# Patient Record
Sex: Female | Born: 1935
Health system: Southern US, Community
[De-identification: ages and names within clinical notes are randomized; demographics above are authoritative.]

## PROBLEM LIST (undated history)

## (undated) DIAGNOSIS — C4491 Basal cell carcinoma of skin, unspecified: Secondary | ICD-10-CM

## (undated) DIAGNOSIS — C4492 Squamous cell carcinoma of skin, unspecified: Secondary | ICD-10-CM

## (undated) DIAGNOSIS — I10 Essential (primary) hypertension: Secondary | ICD-10-CM

## (undated) HISTORY — DX: Squamous cell carcinoma of skin, unspecified: C44.92

## (undated) HISTORY — DX: Basal cell carcinoma of skin, unspecified: C44.91

## (undated) HISTORY — PX: COLONOSCOPY: SHX174

## (undated) HISTORY — PX: CATARACT EXTRACTION: SUR2

## (undated) HISTORY — PX: MINI-LAPAROTOMY W/ TUBAL LIGATION: SUR811

## (undated) HISTORY — PX: TONSILECTOMY, ADENOIDECTOMY, BILATERAL MYRINGOTOMY AND TUBES: SHX2538

## (undated) HISTORY — DX: Essential (primary) hypertension: I10

---

## 1998-05-31 ENCOUNTER — Other Ambulatory Visit: Admission: RE | Admit: 1998-05-31 | Discharge: 1998-05-31 | Payer: Self-pay | Admitting: Obstetrics & Gynecology

## 1999-05-15 ENCOUNTER — Other Ambulatory Visit: Admission: RE | Admit: 1999-05-15 | Discharge: 1999-05-15 | Payer: Self-pay | Admitting: Obstetrics & Gynecology

## 2000-06-11 ENCOUNTER — Other Ambulatory Visit: Admission: RE | Admit: 2000-06-11 | Discharge: 2000-06-11 | Payer: Self-pay | Admitting: Obstetrics & Gynecology

## 2002-07-23 ENCOUNTER — Other Ambulatory Visit: Admission: RE | Admit: 2002-07-23 | Discharge: 2002-07-23 | Payer: Self-pay | Admitting: Obstetrics & Gynecology

## 2003-07-26 ENCOUNTER — Other Ambulatory Visit: Admission: RE | Admit: 2003-07-26 | Discharge: 2003-07-26 | Payer: Self-pay | Admitting: Obstetrics & Gynecology

## 2005-08-21 ENCOUNTER — Other Ambulatory Visit: Admission: RE | Admit: 2005-08-21 | Discharge: 2005-08-21 | Payer: Self-pay | Admitting: Obstetrics & Gynecology

## 2006-04-25 ENCOUNTER — Encounter: Admission: RE | Admit: 2006-04-25 | Discharge: 2006-04-25 | Payer: Self-pay | Admitting: Internal Medicine

## 2009-02-14 ENCOUNTER — Encounter: Admission: RE | Admit: 2009-02-14 | Discharge: 2009-02-14 | Payer: Self-pay | Admitting: Internal Medicine

## 2009-04-18 ENCOUNTER — Encounter: Admission: RE | Admit: 2009-04-18 | Discharge: 2009-04-18 | Payer: Self-pay | Admitting: Internal Medicine

## 2009-04-22 ENCOUNTER — Encounter: Admission: RE | Admit: 2009-04-22 | Discharge: 2009-04-22 | Payer: Self-pay | Admitting: Internal Medicine

## 2009-05-20 ENCOUNTER — Encounter: Admission: RE | Admit: 2009-05-20 | Discharge: 2009-05-20 | Payer: Self-pay | Admitting: Internal Medicine

## 2011-02-22 ENCOUNTER — Encounter: Payer: Self-pay | Admitting: Gastroenterology

## 2011-03-01 NOTE — Letter (Signed)
Summary: Colonoscopy Letter  [facility]  [address] [place], [place] [address]   Phone: [phone]  Fax: [phone]      February 22, 2011 MRN: [id number]   Faith Barrett [address] [place], [place]  [address]   Dear Ms. Grissinger,   According to your medical record, it is time for you to schedule a Colonoscopy. The American Cancer Society recommends this procedure as a method to detect early colon cancer. Patients with a family history of colon cancer, or a personal history of colon polyps or inflammatory bowel disease are at increased risk.  This letter has been generated based on the recommendations made at the time of your procedure. If you feel that in your particular situation this may no longer apply, please contact our office.  Please call our office at [phone] to schedule this appointment or to update your records at your earliest convenience.  Thank you for cooperating with [place] to provide you with the very best care possible.   Sincerely,  Judie Petit T. Russella Dar, M.D. [facility] Gastroenterology Division [phone]

## 2011-03-02 ENCOUNTER — Encounter (INDEPENDENT_AMBULATORY_CARE_PROVIDER_SITE_OTHER): Payer: Self-pay | Admitting: *Deleted

## 2011-03-06 NOTE — Letter (Signed)
Summary: Pre Visit Letter Revised  [facility]  [address] [place], [place] [address]   Phone: [phone]  Fax: [phone]        03/02/2011 MRN: [id number] Faith Barrett [address] [place], [place]  [address]  [place]             Procedure Date:  04-10-11           Recall Colon----Dr. Russella Dar   Welcome to the Gastroenterology Division at [facility].    You are scheduled to see a nurse for your pre-procedure visit on 03-27-11 at 1:30p.m. on the 3rd floor at [facility], [address]  We ask that you try to arrive at our office 15 minutes prior to your appointment time to allow for check-in.  Please take a minute to review the attached form.  If you answer "Yes" to one or more of the questions on the first page, we ask that you call the person listed at your earliest opportunity.  If you answer "No" to all of the questions, please complete the rest of the form and bring it to your appointment.    Your nurse visit will consist of discussing your medical and surgical history, your immediate family medical history, and your medications.   If you are unable to list all of your medications on the form, please bring the medication bottles to your appointment and we will list them.  We will need to be aware of both prescribed and over the counter drugs.  We will need to know exact dosage information as well.    Please be prepared to read and sign documents such as consent forms, a financial agreement, and acknowledgement forms.  If necessary, and with your consent, a friend or relative is welcome to sit-in on the nurse visit with you.  Please bring your insurance card so that we may make a copy of it.  If your insurance requires a referral to see a specialist, please bring your referral form from your primary care physician.  No co-pay is required for this nurse visit.     If you cannot keep your appointment, please call [phone] to cancel or reschedule prior to  your appointment date.  This allows [place] the opportunity to schedule an appointment for another patient in need of care.    Thank you for choosing [facility] for your medical needs.  We appreciate the opportunity to care for you.  Please visit [place] at our website  to learn more about our practice.  Sincerely, The Gastroenterology Division

## 2011-03-27 ENCOUNTER — Encounter: Payer: Self-pay | Admitting: Gastroenterology

## 2011-03-27 ENCOUNTER — Ambulatory Visit (AMBULATORY_SURGERY_CENTER): Payer: Medicare Other

## 2011-03-27 VITALS — Ht 61.0 in | Wt 148.5 lb

## 2011-03-27 DIAGNOSIS — Z1211 Encounter for screening for malignant neoplasm of colon: Secondary | ICD-10-CM

## 2011-03-27 MED ORDER — PEG-KCL-NACL-NASULF-NA ASC-C 100 G PO SOLR: 1.0000 | Freq: Once | ORAL | Status: AC

## 2011-04-03 DIAGNOSIS — D126 Benign neoplasm of colon, unspecified: Secondary | ICD-10-CM

## 2011-04-03 HISTORY — DX: Benign neoplasm of colon, unspecified: D12.6

## 2011-04-10 ENCOUNTER — Encounter: Payer: Self-pay | Admitting: Gastroenterology

## 2011-04-10 ENCOUNTER — Ambulatory Visit (AMBULATORY_SURGERY_CENTER): Payer: Medicare Other | Admitting: Gastroenterology

## 2011-04-10 VITALS — BP 136/84 | HR 72 | Temp 98.1°F | Resp 18 | Ht 61.0 in | Wt 148.0 lb

## 2011-04-10 DIAGNOSIS — K5730 Diverticulosis of large intestine without perforation or abscess without bleeding: Secondary | ICD-10-CM

## 2011-04-10 DIAGNOSIS — D126 Benign neoplasm of colon, unspecified: Secondary | ICD-10-CM

## 2011-04-10 DIAGNOSIS — Z1211 Encounter for screening for malignant neoplasm of colon: Secondary | ICD-10-CM

## 2011-04-10 MED ORDER — SODIUM CHLORIDE 0.9 % IV SOLN: 500.0000 mL | INTRAVENOUS | Status: DC

## 2011-04-10 NOTE — Patient Instructions (Signed)
Refer to blue and green discharge instruction sheets. 

## 2011-04-11 ENCOUNTER — Telehealth: Payer: Self-pay | Admitting: *Deleted

## 2011-04-11 NOTE — Telephone Encounter (Signed)

## 2011-04-16 ENCOUNTER — Encounter: Payer: Self-pay | Admitting: Gastroenterology

## 2011-06-29 ENCOUNTER — Other Ambulatory Visit: Payer: Self-pay

## 2014-12-10 DIAGNOSIS — Z23 Encounter for immunization: Secondary | ICD-10-CM | POA: Diagnosis not present

## 2015-02-28 DIAGNOSIS — Z1231 Encounter for screening mammogram for malignant neoplasm of breast: Secondary | ICD-10-CM | POA: Diagnosis not present

## 2015-04-11 DIAGNOSIS — Z008 Encounter for other general examination: Secondary | ICD-10-CM | POA: Diagnosis not present

## 2015-04-11 DIAGNOSIS — Z0000 Encounter for general adult medical examination without abnormal findings: Secondary | ICD-10-CM | POA: Diagnosis not present

## 2015-04-11 DIAGNOSIS — D509 Iron deficiency anemia, unspecified: Secondary | ICD-10-CM | POA: Diagnosis not present

## 2015-04-11 DIAGNOSIS — E780 Pure hypercholesterolemia: Secondary | ICD-10-CM | POA: Diagnosis not present

## 2015-09-21 DIAGNOSIS — R197 Diarrhea, unspecified: Secondary | ICD-10-CM | POA: Diagnosis not present

## 2015-09-21 DIAGNOSIS — I10 Essential (primary) hypertension: Secondary | ICD-10-CM | POA: Diagnosis not present

## 2015-09-21 DIAGNOSIS — Z23 Encounter for immunization: Secondary | ICD-10-CM | POA: Diagnosis not present

## 2015-09-22 DIAGNOSIS — H52203 Unspecified astigmatism, bilateral: Secondary | ICD-10-CM | POA: Diagnosis not present

## 2015-09-22 DIAGNOSIS — H5213 Myopia, bilateral: Secondary | ICD-10-CM | POA: Diagnosis not present

## 2015-09-22 DIAGNOSIS — H524 Presbyopia: Secondary | ICD-10-CM | POA: Diagnosis not present

## 2015-09-22 DIAGNOSIS — Z961 Presence of intraocular lens: Secondary | ICD-10-CM | POA: Diagnosis not present

## 2016-02-09 ENCOUNTER — Encounter: Payer: Self-pay | Admitting: Gastroenterology

## 2016-03-09 DIAGNOSIS — Z1231 Encounter for screening mammogram for malignant neoplasm of breast: Secondary | ICD-10-CM | POA: Diagnosis not present

## 2016-04-11 DIAGNOSIS — E7800 Pure hypercholesterolemia, unspecified: Secondary | ICD-10-CM | POA: Diagnosis not present

## 2016-04-11 DIAGNOSIS — Z0000 Encounter for general adult medical examination without abnormal findings: Secondary | ICD-10-CM | POA: Diagnosis not present

## 2016-04-11 DIAGNOSIS — D559 Anemia due to enzyme disorder, unspecified: Secondary | ICD-10-CM | POA: Diagnosis not present

## 2016-04-11 DIAGNOSIS — K51419 Inflammatory polyps of colon with unspecified complications: Secondary | ICD-10-CM | POA: Diagnosis not present

## 2016-04-11 DIAGNOSIS — I119 Hypertensive heart disease without heart failure: Secondary | ICD-10-CM | POA: Diagnosis not present

## 2016-04-11 DIAGNOSIS — I152 Hypertension secondary to endocrine disorders: Secondary | ICD-10-CM | POA: Diagnosis not present

## 2016-04-22 ENCOUNTER — Emergency Department (HOSPITAL_COMMUNITY)
Admission: EM | Admit: 2016-04-22 | Discharge: 2016-04-22 | Disposition: A | Discharge status: Home / Self Care | Payer: Medicare Other | Attending: Emergency Medicine | Admitting: Emergency Medicine

## 2016-04-22 ENCOUNTER — Encounter (HOSPITAL_COMMUNITY): Payer: Self-pay

## 2016-04-22 DIAGNOSIS — I10 Essential (primary) hypertension: Secondary | ICD-10-CM | POA: Diagnosis not present

## 2016-04-22 DIAGNOSIS — Z791 Long term (current) use of non-steroidal anti-inflammatories (NSAID): Secondary | ICD-10-CM | POA: Insufficient documentation

## 2016-04-22 DIAGNOSIS — M545 Low back pain, unspecified: Secondary | ICD-10-CM

## 2016-04-22 DIAGNOSIS — Z79899 Other long term (current) drug therapy: Secondary | ICD-10-CM | POA: Diagnosis not present

## 2016-04-22 DIAGNOSIS — M25551 Pain in right hip: Secondary | ICD-10-CM | POA: Diagnosis present

## 2016-04-22 MED ORDER — METAXALONE 400 MG PO TABS: 400.0000 mg | ORAL_TABLET | Freq: Three times a day (TID) | ORAL | Status: DC | PRN

## 2016-04-22 MED ORDER — IBUPROFEN 400 MG PO TABS: 400.0000 mg | ORAL_TABLET | Freq: Three times a day (TID) | ORAL | Status: DC | PRN

## 2016-04-22 NOTE — ED Notes (Signed)
Pt with rt hip pain x 2 months.  Pt states starts off/on with increased activity.  No injury.  Pt was doing yard work on Friday and pain has worsened.

## 2016-04-22 NOTE — ED Provider Notes (Signed)
CSN: [id number]     Arrival date & time 04/22/16  [id number] History   First MD Initiated Contact with Patient 04/22/16 [phone]     Chief Complaint  Patient presents with  . Hip Pain     Patient is a 82 y.o. female presenting with hip pain. The history is provided by the patient.  Hip Pain Pertinent negatives include no abdominal pain.  Patient with right posterior hip pain. Had the last day or 2. States she did some vacuuming and began to hurt after that. No fall. No numbness or weakness. She has had no episodes like this in the past after she had done some weeding in the yard. No change in her urinary habits. No fevers. No cancer history. No fevers. No injection drug use.  Past Medical History  Diagnosis Date  . Cataract   . Hypertension    Past Surgical History  Procedure Laterality Date  . Cataract extraction      Bil/ 2010  . Colonoscopy    . Tonsilectomy, adenoidectomy, bilateral myringotomy and tubes    . Mini-laparotomy w/ tubal ligation     History reviewed. No pertinent family history. Social History  Substance Use Topics  . Smoking status: Never Smoker   . Smokeless tobacco: Never Used  . Alcohol Use: No   OB History    No data available     Review of Systems  Gastrointestinal: Negative for abdominal pain.  Genitourinary: Negative for dysuria.  Musculoskeletal: Positive for gait problem. Negative for back pain, joint swelling, neck pain and neck stiffness.  Skin: Negative for rash and wound.  Hematological: Negative for adenopathy.      Allergies  Review of patient's allergies indicates no known allergies.  Home Medications   Prior to Admission medications   Medication Sig Start Date End Date Taking? Authorizing Provider  calcium-vitamin D (OSCAL WITH D) 500-200 MG-UNIT per tablet Take 1 tablet by mouth daily. Take 2000iu Daily    Yes Historical Provider, MD  lisinopril (PRINIVIL,ZESTRIL) 10 MG tablet Take 10 mg by mouth daily.     Yes Historical Provider, MD   ibuprofen (ADVIL,MOTRIN) 400 MG tablet Take 1 tablet (400 mg total) by mouth every 8 (eight) hours as needed. 04/22/16   Davonna Belling, MD  metaxalone (SKELAXIN) 400 MG tablet Take 1 tablet (400 mg total) by mouth 3 (three) times daily as needed for muscle spasms. 04/22/16   Davonna Belling, MD   BP 126/67 mmHg  Pulse 83  Temp(Src) 98.4 F (36.9 C) (Oral)  Resp 20  Ht 5\' 1"  (1.549 m)  Wt 125 lb (56.7 kg)  BMI 23.63 kg/m2  SpO2 97% Physical Exam  Constitutional: She appears well-developed and well-nourished.  Abdominal: Soft. There is no tenderness.  Musculoskeletal: She exhibits tenderness.  Tenderness to right posterior superior buttock area. Mild erethema that patient states is from a pain patch. Good ROM to right hip and no pain with straight leg raise. No tenderness over right hip laterally.   Neurological: She is alert.  Skin: Skin is warm.    ED Course  Procedures (including critical care time) Labs Review Labs Reviewed - No data to display  Imaging Review No results found. I have personally reviewed and evaluated these images and lab results as part of my medical decision-making.   EKG Interpretation None      MDM   Final diagnoses:  Right-sided low back pain without sciatica    Patient with back pain. Appears to be likely muscle  skeletal. No red flags  Except her age. She   has had similar episodes in the past of exertion. We'll give some treatment and follow-up as needed.   Davonna Belling, MD 04/22/16 [phone]

## 2016-04-22 NOTE — Discharge Instructions (Signed)

## 2016-04-27 ENCOUNTER — Ambulatory Visit
Admission: RE | Admit: 2016-04-27 | Discharge: 2016-04-27 | Disposition: A | Discharge status: Home / Self Care | Payer: Medicare Other | Source: Ambulatory Visit | Attending: Internal Medicine | Admitting: Internal Medicine

## 2016-04-27 ENCOUNTER — Other Ambulatory Visit: Payer: Self-pay | Admitting: Internal Medicine

## 2016-04-27 DIAGNOSIS — M25551 Pain in right hip: Secondary | ICD-10-CM

## 2016-07-24 DIAGNOSIS — I119 Hypertensive heart disease without heart failure: Secondary | ICD-10-CM | POA: Diagnosis not present

## 2016-10-03 DIAGNOSIS — Z23 Encounter for immunization: Secondary | ICD-10-CM | POA: Diagnosis not present

## 2016-10-16 DIAGNOSIS — I119 Hypertensive heart disease without heart failure: Secondary | ICD-10-CM | POA: Diagnosis not present

## 2016-10-16 DIAGNOSIS — M25552 Pain in left hip: Secondary | ICD-10-CM | POA: Diagnosis not present

## 2016-12-28 DIAGNOSIS — J40 Bronchitis, not specified as acute or chronic: Secondary | ICD-10-CM | POA: Diagnosis not present

## 2017-01-07 DIAGNOSIS — J209 Acute bronchitis, unspecified: Secondary | ICD-10-CM | POA: Diagnosis not present

## 2017-01-07 DIAGNOSIS — I10 Essential (primary) hypertension: Secondary | ICD-10-CM | POA: Diagnosis not present

## 2017-01-21 DIAGNOSIS — J208 Acute bronchitis due to other specified organisms: Secondary | ICD-10-CM | POA: Diagnosis not present

## 2017-01-21 DIAGNOSIS — I10 Essential (primary) hypertension: Secondary | ICD-10-CM | POA: Diagnosis not present

## 2017-02-06 DIAGNOSIS — M9903 Segmental and somatic dysfunction of lumbar region: Secondary | ICD-10-CM | POA: Diagnosis not present

## 2017-02-06 DIAGNOSIS — M4726 Other spondylosis with radiculopathy, lumbar region: Secondary | ICD-10-CM | POA: Diagnosis not present

## 2017-02-11 DIAGNOSIS — M9903 Segmental and somatic dysfunction of lumbar region: Secondary | ICD-10-CM | POA: Diagnosis not present

## 2017-02-11 DIAGNOSIS — M4726 Other spondylosis with radiculopathy, lumbar region: Secondary | ICD-10-CM | POA: Diagnosis not present

## 2017-02-12 DIAGNOSIS — M4726 Other spondylosis with radiculopathy, lumbar region: Secondary | ICD-10-CM | POA: Diagnosis not present

## 2017-02-12 DIAGNOSIS — M9903 Segmental and somatic dysfunction of lumbar region: Secondary | ICD-10-CM | POA: Diagnosis not present

## 2017-02-13 DIAGNOSIS — M4726 Other spondylosis with radiculopathy, lumbar region: Secondary | ICD-10-CM | POA: Diagnosis not present

## 2017-02-13 DIAGNOSIS — M9903 Segmental and somatic dysfunction of lumbar region: Secondary | ICD-10-CM | POA: Diagnosis not present

## 2017-02-18 DIAGNOSIS — M4726 Other spondylosis with radiculopathy, lumbar region: Secondary | ICD-10-CM | POA: Diagnosis not present

## 2017-02-18 DIAGNOSIS — M9903 Segmental and somatic dysfunction of lumbar region: Secondary | ICD-10-CM | POA: Diagnosis not present

## 2017-02-19 DIAGNOSIS — M4726 Other spondylosis with radiculopathy, lumbar region: Secondary | ICD-10-CM | POA: Diagnosis not present

## 2017-02-19 DIAGNOSIS — M9903 Segmental and somatic dysfunction of lumbar region: Secondary | ICD-10-CM | POA: Diagnosis not present

## 2017-02-20 DIAGNOSIS — M9903 Segmental and somatic dysfunction of lumbar region: Secondary | ICD-10-CM | POA: Diagnosis not present

## 2017-02-20 DIAGNOSIS — M4726 Other spondylosis with radiculopathy, lumbar region: Secondary | ICD-10-CM | POA: Diagnosis not present

## 2017-02-25 DIAGNOSIS — M9903 Segmental and somatic dysfunction of lumbar region: Secondary | ICD-10-CM | POA: Diagnosis not present

## 2017-02-25 DIAGNOSIS — M4726 Other spondylosis with radiculopathy, lumbar region: Secondary | ICD-10-CM | POA: Diagnosis not present

## 2017-02-26 DIAGNOSIS — M9903 Segmental and somatic dysfunction of lumbar region: Secondary | ICD-10-CM | POA: Diagnosis not present

## 2017-02-26 DIAGNOSIS — M4726 Other spondylosis with radiculopathy, lumbar region: Secondary | ICD-10-CM | POA: Diagnosis not present

## 2017-02-27 DIAGNOSIS — M9903 Segmental and somatic dysfunction of lumbar region: Secondary | ICD-10-CM | POA: Diagnosis not present

## 2017-02-27 DIAGNOSIS — M4726 Other spondylosis with radiculopathy, lumbar region: Secondary | ICD-10-CM | POA: Diagnosis not present

## 2017-03-04 DIAGNOSIS — M9903 Segmental and somatic dysfunction of lumbar region: Secondary | ICD-10-CM | POA: Diagnosis not present

## 2017-03-04 DIAGNOSIS — M4726 Other spondylosis with radiculopathy, lumbar region: Secondary | ICD-10-CM | POA: Diagnosis not present

## 2017-03-11 DIAGNOSIS — M4726 Other spondylosis with radiculopathy, lumbar region: Secondary | ICD-10-CM | POA: Diagnosis not present

## 2017-03-11 DIAGNOSIS — M9903 Segmental and somatic dysfunction of lumbar region: Secondary | ICD-10-CM | POA: Diagnosis not present

## 2017-03-12 DIAGNOSIS — M4726 Other spondylosis with radiculopathy, lumbar region: Secondary | ICD-10-CM | POA: Diagnosis not present

## 2017-03-12 DIAGNOSIS — M9903 Segmental and somatic dysfunction of lumbar region: Secondary | ICD-10-CM | POA: Diagnosis not present

## 2017-03-13 DIAGNOSIS — Z1231 Encounter for screening mammogram for malignant neoplasm of breast: Secondary | ICD-10-CM | POA: Diagnosis not present

## 2017-04-17 DIAGNOSIS — K635 Polyp of colon: Secondary | ICD-10-CM | POA: Diagnosis not present

## 2017-04-17 DIAGNOSIS — Z0000 Encounter for general adult medical examination without abnormal findings: Secondary | ICD-10-CM | POA: Diagnosis not present

## 2017-04-17 DIAGNOSIS — I10 Essential (primary) hypertension: Secondary | ICD-10-CM | POA: Diagnosis not present

## 2017-04-17 DIAGNOSIS — C4490 Unspecified malignant neoplasm of skin, unspecified: Secondary | ICD-10-CM | POA: Diagnosis not present

## 2018-02-05 IMAGING — CR DG HIP (WITH OR WITHOUT PELVIS) 2-3V*R*
4 series · 4 of 4 positions shown · non-contrast
Comparison: None.

CLINICAL DATA: Patient starting having right posterior hip pain at
the waist [DATE] after doing some gardening. She recalls a time while
vacuuming and working in the garden over 1 month ago causing the
same pain.

EXAM:
DG HIP (WITH OR WITHOUT PELVIS) 2-3V RIGHT

[w pelvis upright]
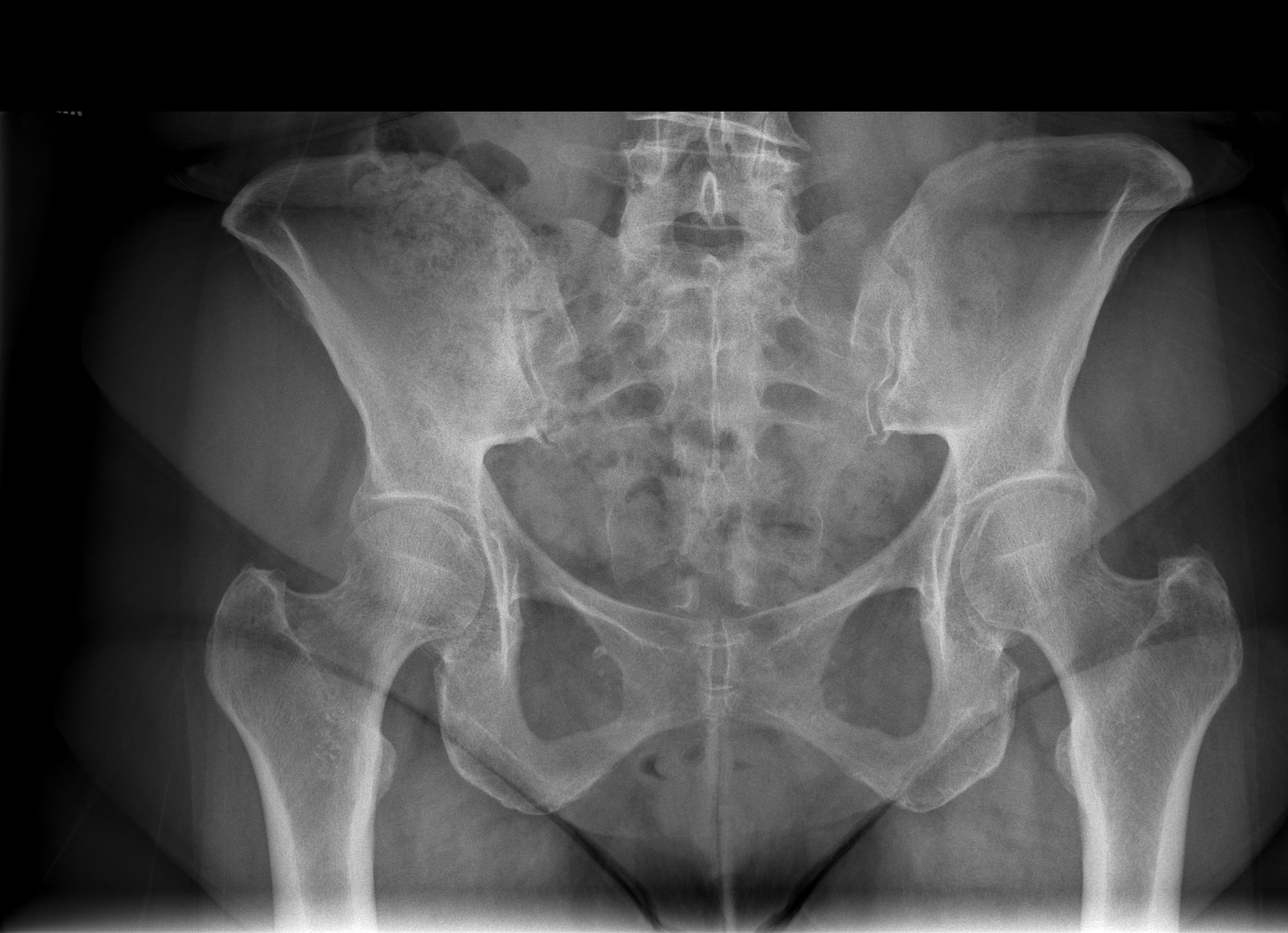

[w hip ap right]
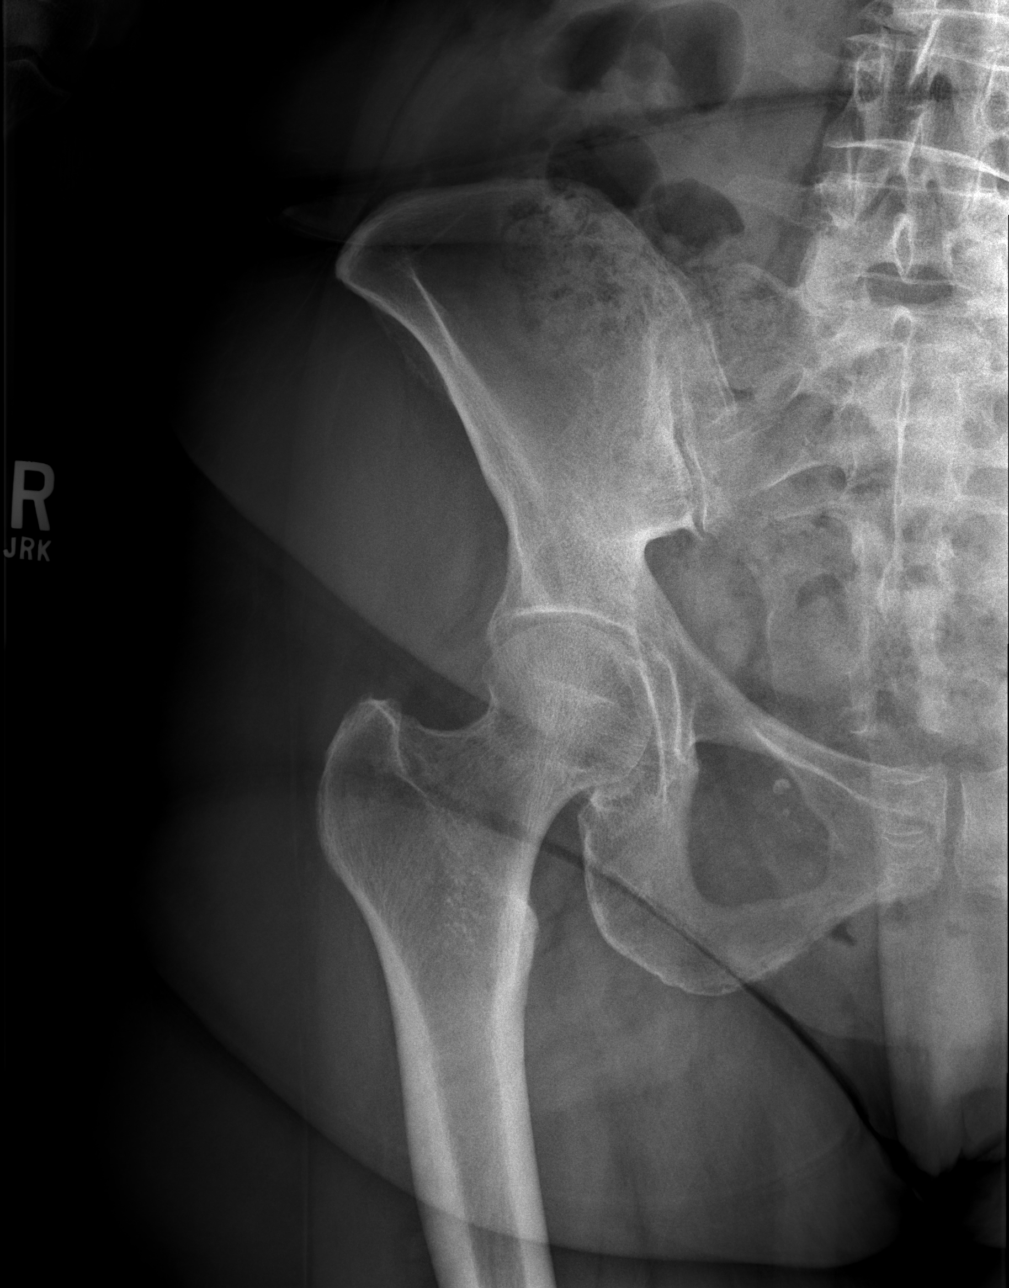

[w hip frog right (1 of 2)]
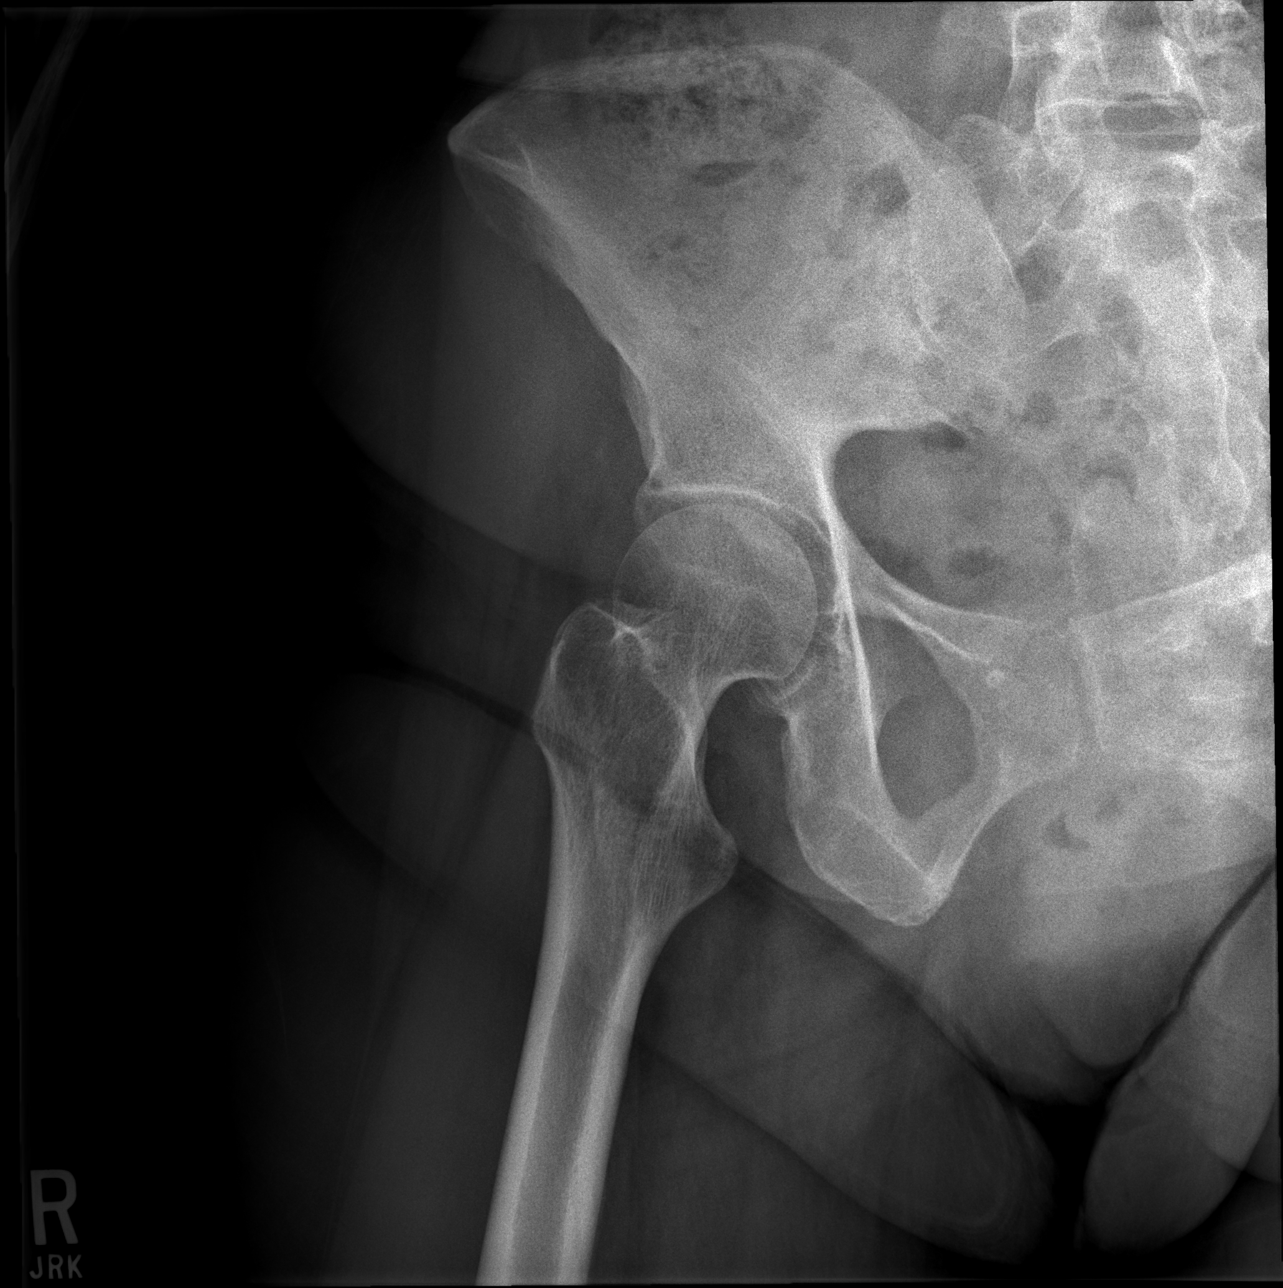

[w hip frog right (2 of 2)]
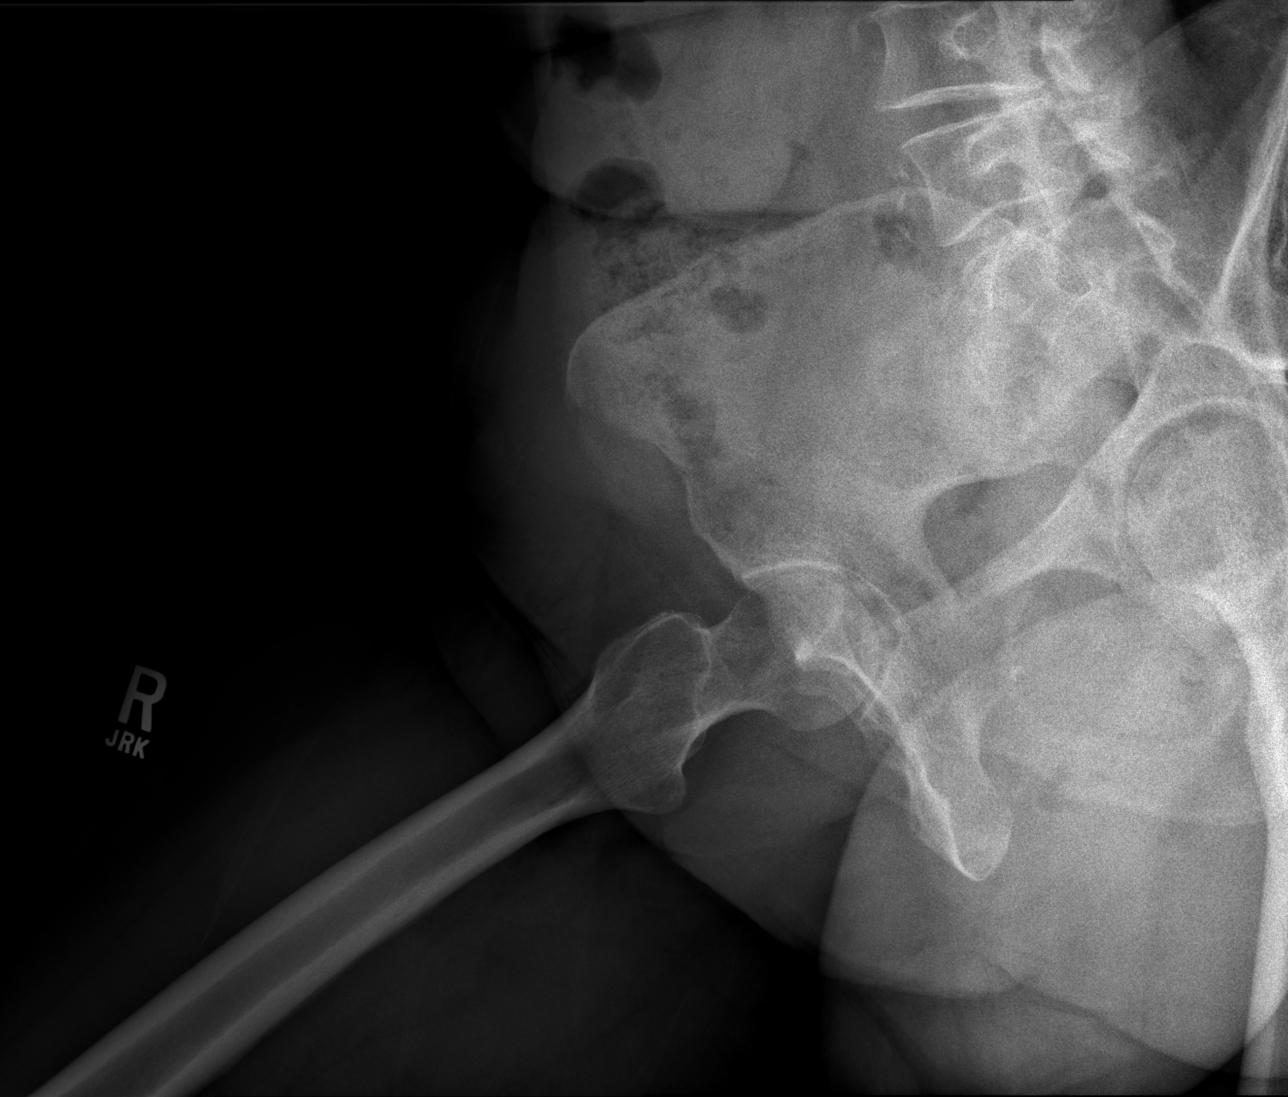

[4 of 4 positions shown; findings below may reference images not displayed]

FINDINGS: There is no evidence of hip fracture or dislocation. There is no
evidence of arthropathy or other focal bone abnormality.
IMPRESSION: Negative.

## 2018-09-27 ENCOUNTER — Encounter (HOSPITAL_COMMUNITY): Payer: Self-pay

## 2018-09-27 ENCOUNTER — Emergency Department (HOSPITAL_COMMUNITY): Payer: Medicare Other

## 2018-09-27 ENCOUNTER — Other Ambulatory Visit: Payer: Self-pay

## 2018-09-27 ENCOUNTER — Emergency Department (HOSPITAL_COMMUNITY)
Admission: EM | Admit: 2018-09-27 | Discharge: 2018-09-27 | Disposition: A | Discharge status: Home / Self Care | Payer: Medicare Other | Attending: Emergency Medicine | Admitting: Emergency Medicine

## 2018-09-27 DIAGNOSIS — I10 Essential (primary) hypertension: Secondary | ICD-10-CM | POA: Insufficient documentation

## 2018-09-27 DIAGNOSIS — Y9302 Activity, running: Secondary | ICD-10-CM | POA: Diagnosis not present

## 2018-09-27 DIAGNOSIS — Y999 Unspecified external cause status: Secondary | ICD-10-CM | POA: Diagnosis not present

## 2018-09-27 DIAGNOSIS — Y929 Unspecified place or not applicable: Secondary | ICD-10-CM | POA: Insufficient documentation

## 2018-09-27 DIAGNOSIS — S43005A Unspecified dislocation of left shoulder joint, initial encounter: Secondary | ICD-10-CM | POA: Diagnosis not present

## 2018-09-27 DIAGNOSIS — S0083XA Contusion of other part of head, initial encounter: Secondary | ICD-10-CM | POA: Diagnosis not present

## 2018-09-27 DIAGNOSIS — W01198A Fall on same level from slipping, tripping and stumbling with subsequent striking against other object, initial encounter: Secondary | ICD-10-CM | POA: Insufficient documentation

## 2018-09-27 DIAGNOSIS — R51 Headache: Secondary | ICD-10-CM | POA: Diagnosis present

## 2018-09-27 DIAGNOSIS — S0003XA Contusion of scalp, initial encounter: Secondary | ICD-10-CM

## 2018-09-27 DIAGNOSIS — S43004A Unspecified dislocation of right shoulder joint, initial encounter: Secondary | ICD-10-CM

## 2018-09-27 MED ORDER — MIDAZOLAM HCL 2 MG/2ML IJ SOLN
INTRAMUSCULAR | Status: AC
  Filled 2018-09-27: qty 2

## 2018-09-27 MED ORDER — HYDROCODONE-ACETAMINOPHEN 5-325 MG PO TABS: 1.0000 | ORAL_TABLET | ORAL | 0 refills | Status: DC | PRN

## 2018-09-27 MED ORDER — FENTANYL CITRATE (PF) 100 MCG/2ML IJ SOLN
100.0000 ug | Freq: Once | INTRAMUSCULAR | Status: AC
  Administered 2018-09-27: 50 ug via INTRAVENOUS
  Filled 2018-09-27: qty 2

## 2018-09-27 MED ORDER — FENTANYL CITRATE (PF) 100 MCG/2ML IJ SOLN
50.0000 ug | INTRAMUSCULAR | Status: DC | PRN
  Administered 2018-09-27: 50 ug via INTRAVENOUS
  Filled 2018-09-27: qty 2

## 2018-09-27 MED ORDER — LORAZEPAM 2 MG/ML IJ SOLN
0.5000 mg | Freq: Once | INTRAMUSCULAR | Status: AC
  Administered 2018-09-27: 0.5 mg via INTRAVENOUS
  Filled 2018-09-27: qty 1

## 2018-09-27 MED ORDER — MIDAZOLAM HCL 2 MG/2ML IJ SOLN
4.0000 mg | Freq: Once | INTRAMUSCULAR | Status: AC
  Administered 2018-09-27: 2 mg via INTRAVENOUS
  Filled 2018-09-27: qty 4

## 2018-09-27 NOTE — ED Notes (Signed)
Patient transported to X-ray 

## 2018-09-27 NOTE — ED Notes (Signed)
X-ray AT BEDSIDE 

## 2018-09-27 NOTE — ED Notes (Signed)
ED Provider at bedside. EDP ALLEN 

## 2018-09-27 NOTE — ED Notes (Signed)
SLING INTACT TO LUE AND ICE APPLIED

## 2018-09-27 NOTE — ED Notes (Signed)
PT TALKING WITH FAMILY. LOOKING AROUND IN ROOM. PT STATES " HE JUST POPPED THAT THING RIGHT IN" " I AM IN NO PAIN"

## 2018-09-27 NOTE — ED Notes (Signed)
Patient transported to CT 

## 2018-09-27 NOTE — ED Provider Notes (Signed)
[facility] DEPT Provider Note   CSN: [id number] Arrival date & time: 09/27/18  1112     History   Chief Complaint Chief Complaint  Patient presents with  . Fall  . Shoulder Injury  . Shoulder Pain    HPI Faith Barrett is a 84 y.o. female.  84 year old female presents after mechanical fall just prior to arrival.  Duke Salvia the front portion of her head but did not have any loss of consciousness.  Denies any neck discomfort.  Complains of severe sharp left shoulder pain that is worse with movement and better with rest.  Denies any distal numbness or tingling to her left hand.  Denies any pain below the waist.  No rib discomfort or abdominal discomfort.  EMS called and patient given pain meds as well as given cervical support with a towel and transported here.     Past Medical History:  Diagnosis Date  . Cataract   . Hypertension     There are no active problems to display for this patient.   Past Surgical History:  Procedure Laterality Date  . CATARACT EXTRACTION     Bil/ 2010  . COLONOSCOPY    . MINI-LAPAROTOMY W/ TUBAL LIGATION    . TONSILECTOMY, ADENOIDECTOMY, BILATERAL MYRINGOTOMY AND TUBES       OB History   None      Home Medications    Prior to Admission medications   Medication Sig Start Date End Date Taking? Authorizing Provider  calcium-vitamin D (OSCAL WITH D) 500-200 MG-UNIT per tablet Take 1 tablet by mouth daily. Take 2000iu Daily    Yes [provider]  lisinopril (PRINIVIL,ZESTRIL) 10 MG tablet Take 10 mg by mouth daily.     Yes [provider]    Family History No family history on file.  Social History Social History   Tobacco Use  . Smoking status: Never Smoker  . Smokeless tobacco: Never Used  Substance Use Topics  . Alcohol use: No  . Drug use: No     Allergies   Patient has no known allergies.   Review of Systems Review of Systems  All other systems reviewed and are  negative.    Physical Exam Updated Vital Signs BP 123/73   Pulse 88   Temp 97.6 F (36.4 C) (Oral)   Resp (!) 24   Ht 1.549 m (5\' 1" )   Wt 59 kg   SpO2 100%   BMI 24.56 kg/m   Physical Exam  Constitutional: She is oriented to person, place, and time. She appears well-developed and well-nourished.  Non-toxic appearance. No distress.  HENT:  Head: Head is with contusion.    Eyes: Pupils are equal, round, and reactive to light. Conjunctivae, EOM and lids are normal.  Neck: Normal range of motion. Neck supple. Muscular tenderness present. No tracheal deviation present. No thyroid mass present.  Cardiovascular: Normal rate, regular rhythm and normal heart sounds. Exam reveals no gallop.  No murmur heard. Pulmonary/Chest: Effort normal and breath sounds normal. No stridor. No respiratory distress. She has no decreased breath sounds. She has no wheezes. She has no rhonchi. She has no rales.  Abdominal: Soft. Normal appearance and bowel sounds are normal. She exhibits no distension. There is no tenderness. There is no rebound and no CVA tenderness.  Musculoskeletal: She exhibits no edema.       Left shoulder: She exhibits decreased range of motion, tenderness, deformity, pain and spasm.  Neurological: She is alert and oriented  to person, place, and time. She has normal strength. No cranial nerve deficit or sensory deficit. GCS eye subscore is 4. GCS verbal subscore is 5. GCS motor subscore is 6.  Skin: Skin is warm and dry. No abrasion and no rash noted.  Psychiatric: She has a normal mood and affect. Her speech is normal and behavior is normal.  Nursing note and vitals reviewed.    ED Treatments / Results  Labs (all labs ordered are listed, but only abnormal results are displayed) Labs Reviewed - No data to display  EKG None  Radiology No results found.  Procedures Reduction of dislocation Date/Time: 09/27/2018 2:00 PM Performed by: Lacretia Leigh, MD Authorized by:  Lacretia Leigh, MD  Consent: Written consent not obtained. Patient identity confirmed: verbally with patient and arm band Time out: Immediately prior to procedure a "time out" was called to verify the correct patient, procedure, equipment, support staff and site/side marked as required. Local anesthesia used: no  Anesthesia: Local anesthesia used: no  Sedation: Patient sedated: yes Sedation type: moderate (conscious) sedation Sedatives: midazolam Analgesia: fentanyl Sedation start date/time: 09/27/2018 2:05 PM Sedation end date/time: 09/27/2018 2:35 PM Vitals: Vital signs were monitored during sedation.  Patient tolerance: Patient tolerated the procedure well with no immediate complications  .Sedation Date/Time: 09/27/2018 3:08 PM Performed by: Lacretia Leigh, MD Authorized by: Lacretia Leigh, MD   Consent:    Consent obtained:  Written   Consent given by:  Patient   Risks discussed:  Allergic reaction   Alternatives discussed:  Analgesia without sedation Universal protocol:    Immediately prior to procedure a time out was called: yes     Patient identity confirmation method:  Arm band Indications:    Procedure performed:  Dislocation reduction   Procedure necessitating sedation performed by:  Physician performing sedation Pre-sedation assessment:    Time since last food or drink:  4 hours   ASA classification: class 2 - patient with mild systemic disease     Neck mobility: normal     Mouth opening:  3 or more finger widths   Mallampati score:  I - soft palate, uvula, fauces, pillars visible   Pre-sedation assessments completed and reviewed: airway patency     Pre-sedation assessment completed:  09/27/2018 2:00 PM Immediate pre-procedure details:    Reassessment: Patient reassessed immediately prior to procedure     Reviewed: vital signs     Verified: emergency equipment available   Procedure details (see MAR for exact dosages):    Preoxygenation:  Nasal cannula    Sedation:  Midazolam   Analgesia:  Fentanyl   Intra-procedure monitoring:  Blood pressure monitoring, cardiac monitor and continuous capnometry   Intra-procedure events: none     Total Provider sedation time (minutes):  30 Post-procedure details:    Post-sedation assessment completed:  09/27/2018 3:09 PM   Attendance: Constant attendance by certified staff until patient recovered     Recovery: Patient returned to pre-procedure baseline     Post-sedation assessments completed and reviewed: airway patency     Patient is stable for discharge or admission: yes     Patient tolerance:  Tolerated well, no immediate complications   (including critical care time)  Medications Ordered in ED Medications  fentaNYL (SUBLIMAZE) injection 50 mcg (50 mcg Intravenous Given 09/27/18 1206)  LORazepam (ATIVAN) injection 0.5 mg (has no administration in time range)     Initial Impression / Assessment and Plan / ED Course  I have reviewed the triage vital signs and the  nursing notes.  Pertinent labs & imaging results that were available during my care of the patient were reviewed by me and considered in my medical decision making (see chart for details).     Shoulder reduced using abduction and external rotation.  Postreduction x-rays show good alignment.  Patient return to baseline.  From sedation.  Stable for discharge.  Will give orthopedic follow-up  Final Clinical Impressions(s) / ED Diagnoses   Final diagnoses:  None    ED Discharge Orders    None       Lacretia Leigh, MD 09/27/18 [phone]

## 2018-09-27 NOTE — ED Triage Notes (Signed)
Per [facility]. Pt resides at home. Full code. Pt was chasing geese. Witnessed mechanical fall. No LOC. Denies neck or back pain. Cervial support with towel roll. Positive deformity to Left shoulder. Positive pulse however weaker than right. Pt c/o of numbness to extremity. Positioned for comfort. Contusion above left eye. Family present. No other complaints. No other trauma noted. Fentanyl 179mcg IVP given 250 cc NS bolus given in route.

## 2018-09-27 NOTE — ED Notes (Signed)
RX WILL BE PICKED UP. ICE PACK WITH FAMILY. SLING INTACT TO LUE

## 2018-09-27 NOTE — ED Notes (Signed)
Bed: [facility] Expected date:  Expected time:  Means of arrival:  Comments: EMS-fall 

## 2018-12-08 ENCOUNTER — Other Ambulatory Visit: Payer: Self-pay

## 2018-12-08 ENCOUNTER — Ambulatory Visit: Payer: Medicare Other | Attending: Specialist | Admitting: Physical Therapy

## 2018-12-08 DIAGNOSIS — M6281 Muscle weakness (generalized): Secondary | ICD-10-CM | POA: Insufficient documentation

## 2018-12-08 DIAGNOSIS — M25612 Stiffness of left shoulder, not elsewhere classified: Secondary | ICD-10-CM | POA: Insufficient documentation

## 2018-12-08 DIAGNOSIS — M25512 Pain in left shoulder: Secondary | ICD-10-CM | POA: Diagnosis present

## 2018-12-08 NOTE — Patient Instructions (Signed)
SHOULDER: External Rotation - Supine (Cane)   Hold cane with both hands. Rotate arm away from body. Keep elbow on bed  and next to body. 20___ reps per set, 3___ sets per day, _7__ days per week Add towel to keep elbow at side.  Cane Exercise: Flexion   Lie on back, holding cane above chest. Keeping arms as straight as possible, lower cane toward floor beyond head.  Repeat _20___ times. Do __3__ sessions per day.  Copyright  VHI. All rights reserved.   Flexion (Passive)   Sitting upright, slide forearm forward along table, bending from the waist until a stretch is felt. Hold _5_ seconds. Repeat __10__ times. Do _2-3___ sessions per day.

## 2018-12-08 NOTE — Therapy (Signed)
[facility] [place], [place], [address] Phone: [phone]   Fax:  [phone]  Physical Therapy Evaluation  Patient Details  Name: Faith Barrett MRN: [id number] Date of Birth: 1932/06/13 Referring Provider (PT): Susa Day   Encounter Date: 12/08/2018  PT End of Session - 12/08/18 1059    Visit Number  1    Date for PT Re-Evaluation  02/02/19    PT Start Time  1100    PT Stop Time  1148    PT Time Calculation (min)  48 min    Activity Tolerance  Patient tolerated treatment well    Behavior During Therapy  [facility] for tasks assessed/performed       Past Medical History:  Diagnosis Date  . Cataract   . Hypertension     Past Surgical History:  Procedure Laterality Date  . CATARACT EXTRACTION     Bil/ 2010  . COLONOSCOPY    . MINI-LAPAROTOMY W/ TUBAL LIGATION    . TONSILECTOMY, ADENOIDECTOMY, BILATERAL MYRINGOTOMY AND TUBES      There were no vitals filed for this visit.   Subjective Assessment - 12/08/18 1109    Subjective  Patient stumbled and fell in Oct 2019 and dislocated left GH joint. Patient reports pain mainly in left upper arm. She has difficulty with reaching outward and overhead.  She does not have much pain but gives it a 1/10 with flexion. It is uncomfortable at night.    Pertinent History  HTN, right ankle fracture 2014    Diagnostic tests  xrays    Patient Stated Goals  get her shoulder to a functional level, to get rid of pain    Currently in Pain?  No/denies         [facility] PT Assessment - 12/08/18 0001      Assessment   Medical Diagnosis  left shoulder pain    Referring Provider (PT)  Susa Day    Onset Date/Surgical Date  09/27/18    Hand Dominance  Right    Prior Therapy  no      Precautions   Precautions  None      Restrictions   Weight Bearing Restrictions  No      Balance Screen   Has the patient fallen in the past 6 months  Yes    How many times?  1     Has the patient had a decrease in activity level because of a fear of falling?   No    Is the patient reluctant to leave their home because of a fear of falling?   No      Home Environment   Living Environment  Private residence    Living Arrangements  Alone    Type of [place] to enter    Additional Comments  Patient has 4 level home and no difficulty with stairs      Prior Function   Level of Independence  Independent    Vocation  Retired    Leisure  garden, Loss adjuster, chartered   Posture/Postural Control  Postural limitations    Postural Limitations  Rounded Shoulders;Decreased thoracic kyphosis   winging R scapula     ROM / Strength   AROM / PROM / Strength  AROM;PROM;Strength      AROM   AROM Assessment Site  Shoulder    Right/Left Shoulder  Left  Left Shoulder Flexion  118 Degrees in supine   Left Shoulder ABduction  60 Degrees in supine   Left Shoulder Internal Rotation  83 Degrees   at 45 deg   Left Shoulder External Rotation  33 Degrees   at 45 deg abd     PROM   PROM Assessment Site  Shoulder    Right/Left Shoulder  Left    Left Shoulder Flexion  138 Degrees in supine   Left Shoulder ABduction  90 Degrees in supine   Left Shoulder Internal Rotation  --   [facility]   Left Shoulder External Rotation  38 Degrees      Strength   Overall Strength Comments  Left elbow and wrist 5/5 without pain    Strength Assessment Site  Shoulder    Right/Left Shoulder  Left    Left Shoulder Flexion  2+/5    Left Shoulder Extension  5/5    Left Shoulder ABduction  2+/5    Left Shoulder Internal Rotation  4+/5    Left Shoulder External Rotation  4+/5      Palpation   Palpation comment  unremarkable                Objective measurements completed on examination: See above findings.              PT Education - 12/08/18 1204    Education Details  HEP    Person(s) Educated  Patient    Methods   Explanation;Demonstration;Handout    Comprehension  Verbalized understanding;Returned demonstration       PT Short Term Goals - 12/08/18 1204      PT SHORT TERM GOAL #1   Title  Ind with initial HEP    Time  2    Period  Weeks    Status  New    Target Date  12/22/18        PT Long Term Goals - 12/08/18 1205      PT LONG TERM GOAL #1   Title  Pt to demo improved left shoulder flexion in standing to 140 deg or better to normalize ADLS    Time  8    Period  Weeks    Status  New    Target Date  02/02/19      PT LONG TERM GOAL #2   Title  Patient able to perform ADLS with left shoulder at 75% of previous level.    Time  8    Period  Weeks    Status  New      PT LONG TERM GOAL #3   Title  Patient to report decreased pain in LUE by 50% with ADLS.    Time  8    Period  Weeks    Status  New      PT LONG TERM GOAL #4   Title   Patient to demo 4+ to 5/5 strength in the left shoulder to normalize ADLS    Time  8    Period  Weeks    Status  New             Plan - 12/08/18 1155    Clinical Impression Statement  Patient presents with c/o left shoulder pain and decreased motion secondary to a fall in Oct 2019 where she dislocated her left shoulder. Her pain is minimal with ADLs and she reports she feels it in her left upper arm. She did have increased pain at end range flex, ABD and  ER in the clinic. She is weak in the left shoulder and would like to get her shoulder back to "where she can do things again". She likes to garden and bowl.    History and Personal Factors relevant to plan of care:  HTN, previous frozen shoulder 50 years ago (not sure which); right ankle fx 2014    Clinical Presentation  Stable    Clinical Decision Making  Low    Rehab Potential  Good    PT Frequency  2x / week    PT Duration  8 weeks    PT Treatment/Interventions  ADLs/Self Care Home Management;Cryotherapy;Electrical Stimulation;Iontophoresis 4mg /ml Dexamethasone;Moist  Heat;Ultrasound;Therapeutic exercise;Neuromuscular re-education;Patient/family education;Manual techniques;Dry needling;Passive range of motion    PT Next Visit Plan  ROM and strengthening for shoulder/postural muscles; modalities prn    PT Home Exercise Plan  supine cane flex/ER; seated table slide for flexion    Consulted and Agree with Plan of Care  Patient       Patient will benefit from skilled therapeutic intervention in order to improve the following deficits and impairments:  Pain, Postural dysfunction, Decreased range of motion, Decreased strength, Impaired UE functional use  Visit Diagnosis: Stiffness of left shoulder, not elsewhere classified - Plan: PT plan of care cert/re-cert  Acute pain of left shoulder - Plan: PT plan of care cert/re-cert  Muscle weakness (generalized) - Plan: PT plan of care cert/re-cert     Problem List There are no active problems to display for this patient.  Madelyn Flavors PT 12/08/2018, 12:42 PM  [place], [place], [address] Phone: [phone]   Fax:  [phone]  Name: Faith Barrett MRN: [id number] Date of Birth: 1932-09-15

## 2018-12-10 ENCOUNTER — Ambulatory Visit: Payer: Medicare Other | Admitting: Physical Therapy

## 2018-12-10 DIAGNOSIS — M6281 Muscle weakness (generalized): Secondary | ICD-10-CM

## 2018-12-10 DIAGNOSIS — M25512 Pain in left shoulder: Secondary | ICD-10-CM

## 2018-12-10 DIAGNOSIS — M25612 Stiffness of left shoulder, not elsewhere classified: Secondary | ICD-10-CM | POA: Diagnosis not present

## 2018-12-10 NOTE — Therapy (Signed)
[place], [place], [address] Phone: [phone]   Fax:  [phone]  Physical Therapy Treatment  Patient Details  Name: Faith Barrett MRN: [id number] Date of Birth: May 03, 1932 Referring Provider (PT): Susa Day   Encounter Date: 12/10/2018  PT End of Session - 12/10/18 1138    Visit Number  2    Date for PT Re-Evaluation  02/02/19    PT Start Time  1100    PT Stop Time  1150    PT Time Calculation (min)  50 min       Past Medical History:  Diagnosis Date  . Cataract   . Hypertension     Past Surgical History:  Procedure Laterality Date  . CATARACT EXTRACTION     Bil/ 2010  . COLONOSCOPY    . MINI-LAPAROTOMY W/ TUBAL LIGATION    . TONSILECTOMY, ADENOIDECTOMY, BILATERAL MYRINGOTOMY AND TUBES      There were no vitals filed for this visit.  Subjective Assessment - 12/10/18 1103    Subjective  bowled yesterday and no pain , just very weak. pain with "over-reaching"    Currently in Pain?  No/denies                       [facility] Adult PT Treatment/Exercise - 12/10/18 0001      Exercises   Exercises  Shoulder      Shoulder Exercises: Standing   External Rotation  Strengthening;Both;20 reps    Theraband Level (Shoulder External Rotation)  Level 1 (Yellow)    External Rotation Limitations  limited motion    Extension  Strengthening;Both;20 reps;Theraband    Theraband Level (Shoulder Extension)  Level 1 (Yellow)    Row  Right;Both;20 reps;Theraband    Theraband Level (Shoulder Row)  Level 1 (Yellow)    Other Standing Exercises  finger ladder 2# with ecc lowering 8 x flex and  no wt abd ( tried but too heavy and pain)    Other Standing Exercises  1# cane ex 10 times each way , PTA assit for flexion      Shoulder Exercises: ROM/Strengthening   UBE (Upper Arm Bike)  L 3 2 fwd/2 back      Shoulder Exercises: Stretch   Corner Stretch  5 reps;10 seconds    External  Rotation Stretch  5 reps;10 seconds   at door     Modalities   Modalities  Cryotherapy      Cryotherapy   Number Minutes Cryotherapy  10 Minutes    Cryotherapy Location  Shoulder    Type of Cryotherapy  Ice pack      Manual Therapy   Manual Therapy  Passive ROM    Passive ROM  left shld all directions- verb and tactile cuing to relax             PT Education - 12/10/18 1138    Education Details  reissued HEP from eval as she did not have handout    Person(s) Educated  Patient    Methods  Demonstration;Handout;Explanation    Comprehension  Verbalized understanding;Returned demonstration       PT Short Term Goals - 12/10/18 1138      PT SHORT TERM GOAL #1   Title  Ind with initial HEP    Status  Achieved        PT Long Term Goals - 12/08/18 1205      PT LONG TERM  GOAL #1   Title  Pt to demo improved left shoulder flexion in standing to 140 deg or better to normalize ADLS    Time  8    Period  Weeks    Status  New    Target Date  02/02/19      PT LONG TERM GOAL #2   Title  Patient able to perform ADLS with left shoulder at 75% of previous level.    Time  8    Period  Weeks    Status  New      PT LONG TERM GOAL #3   Title  Patient to report decreased pain in LUE by 50% with ADLS.    Time  8    Period  Weeks    Status  New      PT LONG TERM GOAL #4   Title   Patient to demo 4+ to 5/5 strength in the left shoulder to normalize ADLS    Time  8    Period  Weeks    Status  New            Plan - 12/10/18 1138    Clinical Impression Statement  STG met. Pt needed multi verb and tactile cuing with PTA assist to not compensate with ex. Pain and tightnes but after mvmt decreased.     PT Treatment/Interventions  ADLs/Self Care Home Management;Cryotherapy;Electrical Stimulation;Iontophoresis 42m/ml Dexamethasone;Moist Heat;Ultrasound;Therapeutic exercise;Neuromuscular re-education;Patient/family education;Manual techniques;Dry needling;Passive range of  motion    PT Next Visit Plan  ROM and strengthening for shoulder/postural muscles; modalities prn       Patient will benefit from skilled therapeutic intervention in order to improve the following deficits and impairments:  Pain, Postural dysfunction, Decreased range of motion, Decreased strength, Impaired UE functional use  Visit Diagnosis: Stiffness of left shoulder, not elsewhere classified  Acute pain of left shoulder  Muscle weakness (generalized)     Problem List There are no active problems to display for this patient.   Monico Sudduth,ANGIE PTA 12/10/2018, 11:40 AM  [place] [address]Phone: [phone]  Fax:  [phone] Name: Faith OTTEMRN: [id number] of Birth: [birth year]

## 2018-12-15 ENCOUNTER — Ambulatory Visit: Payer: Medicare Other | Admitting: Physical Therapy

## 2018-12-15 ENCOUNTER — Encounter: Payer: Self-pay | Admitting: Physical Therapy

## 2018-12-15 DIAGNOSIS — M6281 Muscle weakness (generalized): Secondary | ICD-10-CM

## 2018-12-15 DIAGNOSIS — M25612 Stiffness of left shoulder, not elsewhere classified: Secondary | ICD-10-CM | POA: Diagnosis not present

## 2018-12-15 DIAGNOSIS — M25512 Pain in left shoulder: Secondary | ICD-10-CM

## 2018-12-15 NOTE — Therapy (Signed)
[place], [place], [address] Phone: [phone]   Fax:  [phone]  Physical Therapy Treatment  Patient Details  Name: Faith Barrett MRN: [id number] Date of Birth: July 05, 1932 Referring Provider (PT): Susa Day   Encounter Date: 12/15/2018  PT End of Session - 12/15/18 1222    Visit Number  3    PT Start Time  2703    PT Stop Time  1233    PT Time Calculation (min)  49 min    Activity Tolerance  Patient tolerated treatment well    Behavior During Therapy  [facility] for tasks assessed/performed       Past Medical History:  Diagnosis Date  . Cataract   . Hypertension     Past Surgical History:  Procedure Laterality Date  . CATARACT EXTRACTION     Bil/ 2010  . COLONOSCOPY    . MINI-LAPAROTOMY W/ TUBAL LIGATION    . TONSILECTOMY, ADENOIDECTOMY, BILATERAL MYRINGOTOMY AND TUBES      There were no vitals filed for this visit.  Subjective Assessment - 12/15/18 1149    Subjective  "Feeling ok"    Currently in Pain?  No/denies                       [facility] Adult PT Treatment/Exercise - 12/15/18 0001      Shoulder Exercises: Standing   External Rotation  Strengthening;Both;20 reps    Theraband Level (Shoulder External Rotation)  Level 1 (Yellow)    External Rotation Limitations  limited motion    Internal Rotation  Left;20 reps;Theraband    Theraband Level (Shoulder Internal Rotation)  Level 1 (Yellow)    Extension  Strengthening;Both;20 reps;Theraband    Theraband Level (Shoulder Extension)  Level 1 (Yellow)    Row  Right;Both;20 reps;Theraband    Theraband Level (Shoulder Row)  Level 1 (Yellow)    Other Standing Exercises  finger ladder flex x5     Other Standing Exercises  Cane AAROM flex,ext,IR 2x10       Shoulder Exercises: ROM/Strengthening   UBE (Upper Arm Bike)  L 3 2 fwd/2 back      Modalities   Modalities  Vasopneumatic      Vasopneumatic   Number  Minutes Vasopneumatic   10 minutes    Vasopnuematic Location   Shoulder    Vasopneumatic Pressure  Medium    Vasopneumatic Temperature   34      Manual Therapy   Manual Therapy  Passive ROM    Manual therapy comments  Pt guarded at times.     Passive ROM  L shoulder all directions.                PT Short Term Goals - 12/10/18 1138      PT SHORT TERM GOAL #1   Title  Ind with initial HEP    Status  Achieved        PT Long Term Goals - 12/08/18 1205      PT LONG TERM GOAL #1   Title  Pt to demo improved left shoulder flexion in standing to 140 deg or better to normalize ADLS    Time  8    Period  Weeks    Status  New    Target Date  02/02/19      PT LONG TERM GOAL #2   Title  Patient able to perform ADLS with left shoulder at 75%  of previous level.    Time  8    Period  Weeks    Status  New      PT LONG TERM GOAL #3   Title  Patient to report decreased pain in LUE by 50% with ADLS.    Time  8    Period  Weeks    Status  New      PT LONG TERM GOAL #4   Title   Patient to demo 4+ to 5/5 strength in the left shoulder to normalize ADLS    Time  8    Period  Weeks    Status  New            Plan - 12/15/18 1223    Clinical Impression Statement  Pt required tactile cues with both internal and external rotation to keep L arm to her side. Passive ROM ~ to be patter than active. Active ROM is good but has some limitation with flexion and abduction. Cues to slow down with the exercises.     PT Frequency  2x / week    PT Duration  8 weeks    PT Treatment/Interventions  ADLs/Self Care Home Management;Cryotherapy;Electrical Stimulation;Iontophoresis 4mg /ml Dexamethasone;Moist Heat;Ultrasound;Therapeutic exercise;Neuromuscular re-education;Patient/family education;Manual techniques;Dry needling;Passive range of motion    PT Next Visit Plan  ROM and strengthening for shoulder/postural muscles; modalities prn       Patient will benefit from skilled therapeutic  intervention in order to improve the following deficits and impairments:  Pain, Postural dysfunction, Decreased range of motion, Decreased strength, Impaired UE functional use  Visit Diagnosis: Stiffness of left shoulder, not elsewhere classified  Acute pain of left shoulder  Muscle weakness (generalized)     Problem List There are no active problems to display for this patient.   Scot Jun, PTA 12/15/2018, 12:25 PM  [place], [place], [address] Phone: [phone]   Fax:  [phone]  Name: Faith Barrett MRN: [id number] Date of Birth: 22-Oct-1932

## 2018-12-17 ENCOUNTER — Ambulatory Visit: Payer: Medicare Other | Admitting: Physical Therapy

## 2018-12-17 ENCOUNTER — Encounter: Payer: Self-pay | Admitting: Physical Therapy

## 2018-12-17 DIAGNOSIS — M25612 Stiffness of left shoulder, not elsewhere classified: Secondary | ICD-10-CM

## 2018-12-17 DIAGNOSIS — M25512 Pain in left shoulder: Secondary | ICD-10-CM

## 2018-12-17 DIAGNOSIS — M6281 Muscle weakness (generalized): Secondary | ICD-10-CM

## 2018-12-17 NOTE — Therapy (Signed)
[place], [place], [address] Phone: [phone]   Fax:  [phone]  Physical Therapy Treatment  Patient Details  Name: Faith Barrett MRN: [id number] Date of Birth: March 26, 1932 Referring Provider (PT): Susa Day   Encounter Date: 12/17/2018  PT End of Session - 12/17/18 1216    Visit Number  4    Date for PT Re-Evaluation  02/02/19    PT Start Time  1142    PT Stop Time  1231    PT Time Calculation (min)  49 min    Activity Tolerance  Patient tolerated treatment well    Behavior During Therapy  [facility] for tasks assessed/performed       Past Medical History:  Diagnosis Date  . Cataract   . Hypertension     Past Surgical History:  Procedure Laterality Date  . CATARACT EXTRACTION     Bil/ 2010  . COLONOSCOPY    . MINI-LAPAROTOMY W/ TUBAL LIGATION    . TONSILECTOMY, ADENOIDECTOMY, BILATERAL MYRINGOTOMY AND TUBES      There were no vitals filed for this visit.  Subjective Assessment - 12/17/18 1144    Subjective  "i feel good"    Currently in Pain?  No/denies                       [facility] Adult PT Treatment/Exercise - 12/17/18 0001      Shoulder Exercises: Standing   External Rotation  Strengthening;Both;20 reps    Theraband Level (Shoulder External Rotation)  Level 1 (Yellow)    Internal Rotation  Left;20 reps;Theraband    Theraband Level (Shoulder Internal Rotation)  Level 1 (Yellow)    ABduction  20 reps;AROM;Both    Extension  Strengthening;Both;20 reps;Theraband    Theraband Level (Shoulder Extension)  Level 2 (Red)    Row  Right;Both;20 reps;Theraband    Theraband Level (Shoulder Row)  Level 2 (Red)    Other Standing Exercises  bicep curls 2lb 2x10     Other Standing Exercises  Cane AAROM flex,ext,IR 2x10       Shoulder Exercises: ROM/Strengthening   UBE (Upper Arm Bike)  L 3 3 fwd/23back      Cryotherapy   Number Minutes Cryotherapy  10 Minutes    Cryotherapy Location  Shoulder    Type of Cryotherapy  Ice pack      Manual Therapy   Manual Therapy  Passive ROM    Passive ROM  L shoulder all directions.                PT Short Term Goals - 12/10/18 1138      PT SHORT TERM GOAL #1   Title  Ind with initial HEP    Status  Achieved        PT Long Term Goals - 12/08/18 1205      PT LONG TERM GOAL #1   Title  Pt to demo improved left shoulder flexion in standing to 140 deg or better to normalize ADLS    Time  8    Period  Weeks    Status  New    Target Date  02/02/19      PT LONG TERM GOAL #2   Title  Patient able to perform ADLS with left shoulder at 75% of previous level.    Time  8    Period  Weeks    Status  New  PT LONG TERM GOAL #3   Title  Patient to report decreased pain in LUE by 50% with ADLS.    Time  8    Period  Weeks    Status  New      PT LONG TERM GOAL #4   Title   Patient to demo 4+ to 5/5 strength in the left shoulder to normalize ADLS    Time  8    Period  Weeks    Status  New            Plan - 12/17/18 1218    Clinical Impression Statement  Pt reports that she is pleased with her shoulder. Less motion ER compared to internal rotation. Some difficulty with L shoulder abduction, she reports some pain at ~ 85 degrees. She was able to do L shoulder flexion with 1lb.     PT Frequency  2x / week    PT Duration  8 weeks    PT Treatment/Interventions  ADLs/Self Care Home Management;Cryotherapy;Electrical Stimulation;Iontophoresis 4mg /ml Dexamethasone;Moist Heat;Ultrasound;Therapeutic exercise;Neuromuscular re-education;Patient/family education;Manual techniques;Dry needling;Passive range of motion    PT Next Visit Plan  ROM and strengthening for shoulder/postural muscles; modalities prn       Patient will benefit from skilled therapeutic intervention in order to improve the following deficits and impairments:  Pain, Postural dysfunction, Decreased range of motion, Decreased  strength, Impaired UE functional use  Visit Diagnosis: Stiffness of left shoulder, not elsewhere classified  Acute pain of left shoulder  Muscle weakness (generalized)     Problem List There are no active problems to display for this patient.   Scot Jun, PTA 12/17/2018, 12:21 PM  [place], [place], [address] Phone: [phone]   Fax:  [phone]  Name: Faith Barrett MRN: [id number] Date of Birth: May 10, 1932

## 2019-06-18 ENCOUNTER — Other Ambulatory Visit: Payer: Self-pay | Admitting: Internal Medicine

## 2019-06-18 ENCOUNTER — Ambulatory Visit
Admission: RE | Admit: 2019-06-18 | Discharge: 2019-06-18 | Disposition: A | Discharge status: Home / Self Care | Payer: Medicare Other | Source: Ambulatory Visit | Attending: Internal Medicine | Admitting: Internal Medicine

## 2019-06-18 DIAGNOSIS — R2689 Other abnormalities of gait and mobility: Secondary | ICD-10-CM

## 2019-07-17 ENCOUNTER — Other Ambulatory Visit: Payer: Self-pay | Admitting: Physician Assistant

## 2019-09-30 ENCOUNTER — Emergency Department (HOSPITAL_COMMUNITY)
Admission: EM | Admit: 2019-09-30 | Discharge: 2019-09-30 | Discharge status: Against Medical Advice | Payer: Medicare Other | Attending: Emergency Medicine | Admitting: Emergency Medicine

## 2019-09-30 ENCOUNTER — Other Ambulatory Visit: Payer: Self-pay

## 2019-09-30 DIAGNOSIS — K625 Hemorrhage of anus and rectum: Secondary | ICD-10-CM | POA: Diagnosis present

## 2019-09-30 DIAGNOSIS — Z5321 Procedure and treatment not carried out due to patient leaving prior to being seen by health care provider: Secondary | ICD-10-CM | POA: Diagnosis not present

## 2019-09-30 LAB — CBC
HCT: 45.5 % (ref 36.0–46.0)
Hemoglobin: 14.8 g/dL (ref 12.0–15.0)
MCH: 32.9 pg (ref 26.0–34.0)
MCHC: 32.5 g/dL (ref 30.0–36.0)
MCV: 101.1 fL — ABNORMAL HIGH (ref 80.0–100.0)
Platelets: 219 10*3/uL (ref 150–400)
RBC: 4.5 MIL/uL (ref 3.87–5.11)
RDW: 12.3 % (ref 11.5–15.5)
WBC: 10.3 10*3/uL (ref 4.0–10.5)
nRBC: 0 % (ref 0.0–0.2)

## 2019-09-30 LAB — COMPREHENSIVE METABOLIC PANEL
ALT: 12 U/L (ref 0–44)
AST: 19 U/L (ref 15–41)
Albumin: 4.3 g/dL (ref 3.5–5.0)
Alkaline Phosphatase: 71 U/L (ref 38–126)
Anion gap: 13 (ref 5–15)
BUN: 17 mg/dL (ref 8–23)
CO2: 20 mmol/L — ABNORMAL LOW (ref 22–32)
Calcium: 9.4 mg/dL (ref 8.9–10.3)
Chloride: 105 mmol/L (ref 98–111)
Creatinine, Ser: 0.82 mg/dL (ref 0.44–1.00)
GFR calc Af Amer: >60 mL/min (ref >60)
GFR calc non Af Amer: >60 mL/min (ref >60)
Glucose, Bld: 102 mg/dL — ABNORMAL HIGH (ref 70–99)
Potassium: 3.8 mmol/L (ref 3.5–5.1)
Sodium: 138 mmol/L (ref 135–145)
Total Bilirubin: 1.1 mg/dL (ref 0.3–1.2)
Total Protein: 6.9 g/dL (ref 6.5–8.1)

## 2019-09-30 LAB — TYPE AND SCREEN
ABO/RH(D): A POS
Antibody Screen: NEGATIVE

## 2019-09-30 LAB — ABO/RH: ABO/RH(D): A POS

## 2019-09-30 NOTE — ED Notes (Signed)
Pt called multiple times, no answer 

## 2019-09-30 NOTE — ED Triage Notes (Signed)
Pt sent by pcp for rectal bleeding since yesterday. States she has been changing her pad once per hour. Denies pain n/v.

## 2019-10-07 ENCOUNTER — Encounter: Payer: Self-pay | Admitting: Gastroenterology

## 2019-10-13 ENCOUNTER — Other Ambulatory Visit: Payer: Self-pay | Admitting: Physician Assistant

## 2019-11-11 ENCOUNTER — Ambulatory Visit: Payer: Medicare Other | Admitting: Gastroenterology

## 2019-11-11 ENCOUNTER — Other Ambulatory Visit: Payer: Self-pay

## 2019-11-11 ENCOUNTER — Encounter: Payer: Self-pay | Admitting: Gastroenterology

## 2019-11-11 VITALS — BP 124/64 | HR 61 | Temp 97.6°F | Ht 60.0 in | Wt 135.0 lb

## 2019-11-11 DIAGNOSIS — K921 Melena: Secondary | ICD-10-CM | POA: Diagnosis not present

## 2019-11-11 DIAGNOSIS — Z1159 Encounter for screening for other viral diseases: Secondary | ICD-10-CM

## 2019-11-11 MED ORDER — NA SULFATE-K SULFATE-MG SULF 17.5-3.13-1.6 GM/177ML PO SOLN: 1.0000 | Freq: Once | ORAL | 0 refills | Status: AC

## 2019-11-11 NOTE — Patient Instructions (Signed)
You have been scheduled for a colonoscopy. Please follow written instructions given to you at your visit today.  Please pick up your prep supplies at the pharmacy within the next 1-3 days. If you use inhalers (even only as needed), please bring them with you on the day of your procedure.  Thank you for choosing me and [facility].  Malcolm T. Stark, Jr., MD., FACG  

## 2019-11-11 NOTE — Progress Notes (Signed)
History of Present Illness: This is an 85 year female referred by Levin Erp, MD for the evaluation of rectal bleeding.  She had the sudden onset of rectal bleeding multiple times over the course of 1 day without associated abdominal pain or other symptoms.  Initially she did have nausea and vomiting prior to her first bowel movement which resolved.  On the advice of her PCP she went to the emergency department however she did not stay for full evaluation due to substantial delays.  CBC was unremarkable.  After 24 hours her symptoms completely resolved and she has felt well since.  Colonoscopy performed in 2012 showed moderate sigmoid colon diverticulosis and an 8 mm adenomatous sigmoid colon polyp. Denies weight loss, abdominal pain, constipation, change in stool caliber, melena, nausea, vomiting, dysphagia, reflux symptoms, chest pain.     No Known Allergies Outpatient Medications Prior to Visit  Medication Sig Dispense Refill  . calcium-vitamin D (OSCAL WITH D) 500-200 MG-UNIT per tablet Take 1 tablet by mouth daily. Take 2000iu Daily     . lisinopril (PRINIVIL,ZESTRIL) 10 MG tablet Take 10 mg by mouth daily.      Marland Kitchen HYDROcodone-acetaminophen (NORCO/VICODIN) 5-325 MG tablet Take 1-2 tablets by mouth every 4 (four) hours as needed. 15 tablet 0  . 0.9 %  sodium chloride infusion      No facility-administered medications prior to visit.    Past Medical History:  Diagnosis Date  . Cataract   . Hypertension   . Tubular adenoma of colon 04/2011   Past Surgical History:  Procedure Laterality Date  . CATARACT EXTRACTION     Bil/ 2010  . COLONOSCOPY    . MINI-LAPAROTOMY W/ TUBAL LIGATION    . TONSILECTOMY, ADENOIDECTOMY, BILATERAL MYRINGOTOMY AND TUBES     Social History   Socioeconomic History  . Marital status: Single    Spouse name: Not on file  . Number of children: Not on file  . Years of education: Not on file  . Highest education level: Not on file  Occupational History   . Not on file  Social Needs  . Financial resource strain: Not on file  . Food insecurity    Worry: Not on file    Inability: Not on file  . Transportation needs    Medical: Not on file    Non-medical: Not on file  Tobacco Use  . Smoking status: Never Smoker  . Smokeless tobacco: Never Used  Substance and Sexual Activity  . Alcohol use: No  . Drug use: No  . Sexual activity: Not on file  Lifestyle  . Physical activity    Days per week: Not on file    Minutes per session: Not on file  . Stress: Not on file  Relationships  . Social Herbalist on phone: Not on file    Gets together: Not on file    Attends religious service: Not on file    Active member of club or organization: Not on file    Attends meetings of clubs or organizations: Not on file    Relationship status: Not on file  Other Topics Concern  . Not on file  Social History Narrative  . Not on file   Family History  Problem Relation Age of Onset  . Stroke Mother 84  . Lung cancer Father   . Colon cancer Neg Hx   . Stomach cancer Neg Hx   . Pancreatic cancer Neg Hx  Review of Systems: Pertinent positive and negative review of systems were noted in the above HPI section. All other review of systems were otherwise negative.    Physical Exam: General: Well developed, well nourished, no acute distress Head: Normocephalic and atraumatic Eyes:  sclerae anicteric, EOMI Ears: Normal auditory acuity Mouth: No deformity or lesions Neck: Supple, no masses or thyromegaly Lungs: Clear throughout to auscultation Heart: Regular rate and rhythm; no murmurs, rubs or bruits Abdomen: Soft, non tender and non distended. No masses, hepatosplenomegaly or hernias noted. Normal Bowel sounds Rectal: Deferred to colonoscopy Musculoskeletal: Symmetrical with no gross deformities  Skin: No lesions on visible extremities Pulses:  Normal pulses noted Extremities: No clubbing, cyanosis, edema or deformities noted  Neurological: Alert oriented x 4, grossly nonfocal Cervical Nodes:  No significant cervical adenopathy Inguinal Nodes: No significant inguinal adenopathy Psychological:  Alert and cooperative. Normal mood and affect   Assessment and Recommendations:  1.  Hematochezia, self-limited, resolved.  Suspected diverticular bleed.  Rule out colorectal neoplasms.  Less likely ischemic colitis without abdominal pain.  Recommended further evaluation with colonoscopy. The risks (including bleeding, perforation, infection, missed lesions, medication reactions and possible hospitalization or surgery if complications occur), benefits, and alternatives to colonoscopy with possible biopsy and possible polypectomy were discussed with the patient and they consent to proceed.   2.  Personal history of adenomatous colon polyps.   cc: Levin Erp, MD [address] [place],  [place] [address]

## 2019-11-17 ENCOUNTER — Encounter: Payer: Self-pay | Admitting: Gastroenterology

## 2019-11-19 ENCOUNTER — Other Ambulatory Visit: Payer: Self-pay | Admitting: Gastroenterology

## 2019-11-19 ENCOUNTER — Ambulatory Visit (INDEPENDENT_AMBULATORY_CARE_PROVIDER_SITE_OTHER): Payer: Medicare Other

## 2019-11-19 DIAGNOSIS — Z1159 Encounter for screening for other viral diseases: Secondary | ICD-10-CM

## 2019-11-19 LAB — SARS CORONAVIRUS 2 (TAT 6-24 HRS): SARS Coronavirus 2: NEGATIVE

## 2019-11-23 ENCOUNTER — Other Ambulatory Visit: Payer: Self-pay

## 2019-11-23 ENCOUNTER — Ambulatory Visit (AMBULATORY_SURGERY_CENTER): Payer: Medicare Other | Admitting: Gastroenterology

## 2019-11-23 ENCOUNTER — Encounter: Payer: Self-pay | Admitting: Gastroenterology

## 2019-11-23 VITALS — BP 122/60 | HR 72 | Temp 98.0°F | Resp 13 | Ht 60.0 in | Wt 135.0 lb

## 2019-11-23 DIAGNOSIS — K640 First degree hemorrhoids: Secondary | ICD-10-CM | POA: Diagnosis not present

## 2019-11-23 DIAGNOSIS — K921 Melena: Secondary | ICD-10-CM

## 2019-11-23 DIAGNOSIS — K5730 Diverticulosis of large intestine without perforation or abscess without bleeding: Secondary | ICD-10-CM | POA: Diagnosis not present

## 2019-11-23 MED ORDER — SODIUM CHLORIDE 0.9 % IV SOLN: 500.0000 mL | Freq: Once | INTRAVENOUS | Status: DC

## 2019-11-23 NOTE — Op Note (Signed)
[place] Patient Name: Faith Barrett Procedure Date: 11/23/2019 3:12 PM MRN: [id number] Endoscopist: Ladene Artist , MD Age: 85 Referring MD:  Date of Birth: 1932/02/09 Gender: Female Account #: [id number] Procedure:                Colonoscopy Indications:              Hematochezia Medicines:                Monitored Anesthesia Care Procedure:                Pre-Anesthesia Assessment:                           - Prior to the procedure, a History and Physical                            was performed, and patient medications and                            allergies were reviewed. The patient's tolerance of                            previous anesthesia was also reviewed. The risks                            and benefits of the procedure and the sedation                            options and risks were discussed with the patient.                            All questions were answered, and informed consent                            was obtained. Prior Anticoagulants: The patient has                            taken no previous anticoagulant or antiplatelet                            agents. ASA Grade Assessment: II - A patient with                            mild systemic disease. After reviewing the risks                            and benefits, the patient was deemed in                            satisfactory condition to undergo the procedure.                           After obtaining informed consent, the colonoscope  was passed under direct vision. Throughout the                            procedure, the patient's blood pressure, pulse, and                            oxygen saturations were monitored continuously. The                            Colonoscope was introduced through the anus and                            advanced to the the cecum, identified by                            appendiceal orifice and ileocecal valve. The                  ileocecal valve, appendiceal orifice, and rectum                            were photographed. The quality of the bowel                            preparation was good. The colonoscopy was performed                            without difficulty. The patient tolerated the                            procedure well. Scope In: 3:17:24 PM Scope Out: 3:35:19 PM Scope Withdrawal Time: 0 hours 14 minutes 40 seconds  Total Procedure Duration: 0 hours 17 minutes 55 seconds  Findings:                 The perianal and digital rectal examinations were                            normal.                           Many medium-mouthed diverticula were found in the                            left colon. There was no evidence of diverticular                            bleeding.                           Internal hemorrhoids were found during                            retroflexion. The hemorrhoids were small and Grade                            I (internal hemorrhoids that  do not prolapse).                           The exam was otherwise without abnormality on                            direct and retroflexion views. Complications:            No immediate complications. Estimated blood loss:                            None. Estimated Blood Loss:     Estimated blood loss: none. Impression:               - Moderate diverticulosis in the left colon.                           - Internal hemorrhoids.                           - The examination was otherwise normal on direct                            and retroflexion views.                           - No specimens collected. Recommendation:           - Patient has a contact number available for                            emergencies. The signs and symptoms of potential                            delayed complications were discussed with the                            patient. Return to normal activities tomorrow.                             Written discharge instructions were provided to the                            patient.                           - High fiber diet.                           - Continue present medications.                           - No repeat colonoscopy due to age.                           - Follow up with PCP. Ladene Artist, MD 11/23/2019 3:40:02 PM This report has been signed electronically.

## 2019-11-23 NOTE — Patient Instructions (Signed)
You do not need another colonoscopy due to your age. Please return to your primary care provider and follow-up with them. Today Dr Fuller Plan noted: diverticulosis and hemorrhoids        YOU HAD AN ENDOSCOPIC PROCEDURE TODAY AT [facility]:   Refer to the procedure report that was given to you for any specific questions about what was found during the examination.  If the procedure report does not answer your questions, please call your gastroenterologist to clarify.  If you requested that your care partner not be given the details of your procedure findings, then the procedure report has been included in a sealed envelope for you to review at your convenience later.  YOU SHOULD EXPECT: Some feelings of bloating in the abdomen. Passage of more gas than usual.  Walking can help get rid of the air that was put into your GI tract during the procedure and reduce the bloating. If you had a lower endoscopy (such as a colonoscopy or flexible sigmoidoscopy) you may notice spotting of blood in your stool or on the toilet paper. If you underwent a bowel prep for your procedure, you may not have a normal bowel movement for a few days.  Please Note:  You might notice some irritation and congestion in your nose or some drainage.  This is from the oxygen used during your procedure.  There is no need for concern and it should clear up in a day or so.  SYMPTOMS TO REPORT IMMEDIATELY:   Following lower endoscopy (colonoscopy or flexible sigmoidoscopy):  Excessive amounts of blood in the stool  Significant tenderness or worsening of abdominal pains  Swelling of the abdomen that is new, acute  Fever of 100F or higher   For urgent or emergent issues, a gastroenterologist can be reached at any hour by calling [phone].   DIET:  We do recommend a small meal at first, but then you may proceed to your regular diet.  Drink plenty of fluids but you should avoid alcoholic beverages for 24  hours.  ACTIVITY:  You should plan to take it easy for the rest of today and you should NOT DRIVE or use heavy machinery until tomorrow (because of the sedation medicines used during the test).    FOLLOW UP: Our staff will call the number listed on your records 48-72 hours following your procedure to check on you and address any questions or concerns that you may have regarding the information given to you following your procedure. If we do not reach you, we will leave a message.  We will attempt to reach you two times.  During this call, we will ask if you have developed any symptoms of COVID 19. If you develop any symptoms (ie: fever, flu-like symptoms, shortness of breath, cough etc.) before then, please call [phone].  If you test positive for Covid 19 in the 2 weeks post procedure, please call and report this information to [place].    If any biopsies were taken you will be contacted by phone or by letter within the next 1-3 weeks.  Please call us at [phone] if you have not heard about the biopsies in 3 weeks.    SIGNATURES/CONFIDENTIALITY: You and/or your care partner have signed paperwork which will be entered into your electronic medical record.  These signatures attest to the fact that that the information above on your After Visit Summary has been reviewed and is understood.  Full responsibility of the confidentiality of this  discharge information lies with you and/or your care-partner. 

## 2019-11-23 NOTE — Progress Notes (Signed)
PT taken to PACU. Monitors in place. VSS. Report given to RN. 

## 2019-11-23 NOTE — Progress Notes (Signed)
Temp JB  VS  DT  Pt's states no medical or surgical changes since previsit or office visit. Reviewed by admitting RN

## 2019-11-25 ENCOUNTER — Telehealth: Payer: Self-pay | Admitting: *Deleted

## 2019-11-25 NOTE — Telephone Encounter (Signed)
  Follow up Call-  Call back number 11/23/2019  Post procedure Call Back phone  # [phone]  [phone]  Permission to leave phone message Yes  Some recent data might be hidden     Patient questions:  Do you have a fever, pain , or abdominal swelling? No. Pain Score  0 *  Have you tolerated food without any problems? Yes.    Have you been able to return to your normal activities? Yes.    Do you have any questions about your discharge instructions: Diet   No. Medications  No. Follow up visit  No.  Do you have questions or concerns about your Care? No.  Actions: * If pain score is 4 or above: No action needed, pain <4.  1. Have you developed a fever since your procedure? no  2.   Have you had an respiratory symptoms (SOB or cough) since your procedure? no  3.   Have you tested positive for COVID 19 since your procedure no  4.   Have you had any family members/close contacts diagnosed with the COVID 19 since your procedure?  no   If yes to any of these questions please route to Joylene John, RN and Alphonsa Gin, Therapist, sports.

## 2020-02-08 ENCOUNTER — Ambulatory Visit: Payer: Medicare Other | Attending: Internal Medicine

## 2020-02-08 DIAGNOSIS — Z23 Encounter for immunization: Secondary | ICD-10-CM | POA: Insufficient documentation

## 2020-02-08 NOTE — Progress Notes (Signed)
   Covid-19 Vaccination Clinic  Name:  Faith Barrett    MRN: [id number] DOB: 1932/02/02  02/08/2020  [name] was observed post Covid-19 immunization for 15 minutes without incident. She was provided with Vaccine Information Sheet and instruction to access the V-Safe system.   [name] was instructed to call 911 with any severe reactions post vaccine: Marland Kitchen Difficulty breathing  . Swelling of face and throat  . A fast heartbeat  . A bad rash all over body  . Dizziness and weakness   Immunizations Administered    Name Date Dose VIS Date Route   Pfizer COVID-19 Vaccine 02/08/2020 11:00 AM 0.3 mL 11/13/2019 Intramuscular   Manufacturer: [place]   Lot: [id number]   [place]: [id number]

## 2020-03-16 ENCOUNTER — Ambulatory Visit: Payer: Medicare Other | Attending: Internal Medicine

## 2020-03-16 DIAGNOSIS — Z23 Encounter for immunization: Secondary | ICD-10-CM

## 2020-03-16 NOTE — Progress Notes (Signed)
   Covid-19 Vaccination Clinic  Name:  Faith Barrett    MRN: [id number] DOB: 1932/09/30  03/16/2020  [name] was observed post Covid-19 immunization for 15 minutes without incident. She was provided with Vaccine Information Sheet and instruction to access the V-Safe system.   [name] was instructed to call 911 with any severe reactions post vaccine: Marland Kitchen Difficulty breathing  . Swelling of face and throat  . A fast heartbeat  . A bad rash all over body  . Dizziness and weakness   Immunizations Administered    Name Date Dose VIS Date Route   Pfizer COVID-19 Vaccine 03/16/2020 10:32 AM 0.3 mL 11/13/2019 Intramuscular   Manufacturer: [place]   Lot: [id number]   [place]: [id number]

## 2021-01-06 ENCOUNTER — Ambulatory Visit: Payer: Medicare Other | Admitting: Physician Assistant

## 2021-01-06 ENCOUNTER — Encounter: Payer: Self-pay | Admitting: Physician Assistant

## 2021-01-06 ENCOUNTER — Other Ambulatory Visit: Payer: Self-pay

## 2021-01-06 DIAGNOSIS — Z85828 Personal history of other malignant neoplasm of skin: Secondary | ICD-10-CM | POA: Diagnosis not present

## 2021-01-06 DIAGNOSIS — L821 Other seborrheic keratosis: Secondary | ICD-10-CM

## 2021-01-06 DIAGNOSIS — Z1283 Encounter for screening for malignant neoplasm of skin: Secondary | ICD-10-CM

## 2021-01-06 DIAGNOSIS — Z8589 Personal history of malignant neoplasm of other organs and systems: Secondary | ICD-10-CM

## 2021-01-06 DIAGNOSIS — L820 Inflamed seborrheic keratosis: Secondary | ICD-10-CM | POA: Diagnosis not present

## 2021-01-23 ENCOUNTER — Encounter: Payer: Self-pay | Admitting: Physician Assistant

## 2021-01-23 NOTE — Progress Notes (Signed)
   Follow-Up Visit   Subjective  Faith Barrett is a 87 y.o. female who presents for the following: Annual Exam (Follow up mohs on the left Nare 01/26/20 no concerns ).   The following portions of the chart were reviewed this encounter and updated as appropriate:  Tobacco  Allergies  Meds  Problems  Med Hx  Surg Hx  Fam Hx      Objective  Well appearing patient in no apparent distress; mood and affect are within normal limits.  A full examination was performed including scalp, head, eyes, ears, nose, lips, neck, chest, axillae, abdomen, back, buttocks, bilateral upper extremities, bilateral lower extremities, hands, feet, fingers, toes, fingernails, and toenails. All findings within normal limits unless otherwise noted below.  Objective  Neck - Anterior: White scar- clear  Objective  Mid Back, Mid Tip of Nose: Multiple white scar- clear  Objective  Chest - Medial [facility]): Full body skin examination  Objective  Left Parotid Area: Multiple stuck-on, waxy, tan-brown papules and plaques. --Discussed benign etiology and prognosis.    Assessment & Plan  History of squamous cell carcinoma Neck - Anterior  Yearly skin check  History of basal cell carcinoma (BCC) (2) Mid Back; Mid Tip of Nose  Yearly skin check  Screening exam for skin cancer Chest - Medial (Center)  Yearly skin check  Seborrheic keratosis Left Parotid Area  Okay to leave if stable  Seborrheic keratosis, inflamed (4) Left Supraclavicular Area (2); Right Supraclavicular Area (2)  Destruction of lesion - Left Supraclavicular Area, Right Supraclavicular Area Complexity: simple   Destruction method: cryotherapy   Informed consent: discussed and consent obtained   Timeout:  patient name, date of birth, surgical site, and procedure verified Lesion destroyed using liquid nitrogen: Yes   Cryotherapy cycles:  3 Outcome: patient tolerated procedure well with no complications      I,  Donyea Beverlin, PA-C, have reviewed all documentation's for this visit.  The documentation on 01/23/21 for the exam, diagnosis, procedures and orders are all accurate and complete.

## 2021-06-20 DIAGNOSIS — R059 Cough, unspecified: Secondary | ICD-10-CM | POA: Diagnosis not present

## 2021-06-20 DIAGNOSIS — Z8616 Personal history of COVID-19: Secondary | ICD-10-CM | POA: Diagnosis not present

## 2021-06-20 DIAGNOSIS — R5383 Other fatigue: Secondary | ICD-10-CM | POA: Diagnosis not present

## 2021-07-11 ENCOUNTER — Ambulatory Visit: Payer: Medicare Other | Admitting: Physician Assistant

## 2021-07-11 ENCOUNTER — Other Ambulatory Visit: Payer: Self-pay

## 2021-07-11 ENCOUNTER — Encounter: Payer: Self-pay | Admitting: Physician Assistant

## 2021-07-11 DIAGNOSIS — L821 Other seborrheic keratosis: Secondary | ICD-10-CM | POA: Diagnosis not present

## 2021-07-11 DIAGNOSIS — Z1283 Encounter for screening for malignant neoplasm of skin: Secondary | ICD-10-CM | POA: Diagnosis not present

## 2021-07-11 DIAGNOSIS — L570 Actinic keratosis: Secondary | ICD-10-CM | POA: Diagnosis not present

## 2021-07-11 DIAGNOSIS — Z85828 Personal history of other malignant neoplasm of skin: Secondary | ICD-10-CM | POA: Diagnosis not present

## 2021-07-11 NOTE — Patient Instructions (Addendum)
Clotrimazole for the left side under bra area apply nightly may find this at [employer] or dollar store

## 2021-07-12 ENCOUNTER — Encounter: Payer: Self-pay | Admitting: Physician Assistant

## 2021-07-12 NOTE — Progress Notes (Signed)
   Follow-Up Visit   Subjective  Faith Barrett is a 87 y.o. female who presents for the following: Follow-up (Left side new spot under her bra x months, history of bcc and scc ).   The following portions of the chart were reviewed this encounter and updated as appropriate:  Tobacco  Allergies  Meds  Problems  Med Hx  Surg Hx  Fam Hx      Objective  Well appearing patient in no apparent distress; mood and affect are within normal limits.  All skin waist up examined.  Dorsum of Nose Erythematous patches with gritty scale.  Left Flank Skin toned to light brown plaques.  Assessment & Plan  AK (actinic keratosis) Dorsum of Nose  6 month follow up  Destruction of lesion - Dorsum of Nose Complexity: simple   Destruction method: cryotherapy   Informed consent: discussed and consent obtained   Timeout:  patient name, date of birth, surgical site, and procedure verified Lesion destroyed using liquid nitrogen: Yes   Cryotherapy cycles:  3 Outcome: patient tolerated procedure well with no complications   Post-procedure details: wound care instructions given    Seborrheic keratosis Left Flank  observe   I, Shayden Gingrich, PA-C, have reviewed all documentation's for this visit.  The documentation on 07/12/21 for the exam, diagnosis, procedures and orders are all accurate and complete.

## 2021-07-26 DIAGNOSIS — I10 Essential (primary) hypertension: Secondary | ICD-10-CM | POA: Diagnosis not present

## 2021-07-26 DIAGNOSIS — E559 Vitamin D deficiency, unspecified: Secondary | ICD-10-CM | POA: Diagnosis not present

## 2021-08-02 DIAGNOSIS — Z23 Encounter for immunization: Secondary | ICD-10-CM | POA: Diagnosis not present

## 2021-08-02 DIAGNOSIS — Z0000 Encounter for general adult medical examination without abnormal findings: Secondary | ICD-10-CM | POA: Diagnosis not present

## 2021-08-02 DIAGNOSIS — I10 Essential (primary) hypertension: Secondary | ICD-10-CM | POA: Diagnosis not present

## 2021-08-02 DIAGNOSIS — E559 Vitamin D deficiency, unspecified: Secondary | ICD-10-CM | POA: Diagnosis not present

## 2022-01-17 ENCOUNTER — Ambulatory Visit: Payer: Medicare Other | Admitting: Physician Assistant

## 2022-01-31 ENCOUNTER — Ambulatory Visit: Payer: Medicare Other | Admitting: Physician Assistant

## 2022-02-06 DIAGNOSIS — I10 Essential (primary) hypertension: Secondary | ICD-10-CM | POA: Diagnosis not present

## 2022-02-06 DIAGNOSIS — E559 Vitamin D deficiency, unspecified: Secondary | ICD-10-CM | POA: Diagnosis not present

## 2022-02-06 DIAGNOSIS — E538 Deficiency of other specified B group vitamins: Secondary | ICD-10-CM | POA: Diagnosis not present

## 2022-02-12 DIAGNOSIS — E538 Deficiency of other specified B group vitamins: Secondary | ICD-10-CM | POA: Diagnosis not present

## 2022-02-12 DIAGNOSIS — I10 Essential (primary) hypertension: Secondary | ICD-10-CM | POA: Diagnosis not present

## 2022-02-12 DIAGNOSIS — Z79899 Other long term (current) drug therapy: Secondary | ICD-10-CM | POA: Diagnosis not present

## 2022-02-12 DIAGNOSIS — R946 Abnormal results of thyroid function studies: Secondary | ICD-10-CM | POA: Diagnosis not present

## 2022-02-12 DIAGNOSIS — E559 Vitamin D deficiency, unspecified: Secondary | ICD-10-CM | POA: Diagnosis not present

## 2022-02-12 DIAGNOSIS — R7309 Other abnormal glucose: Secondary | ICD-10-CM | POA: Diagnosis not present

## 2022-05-09 ENCOUNTER — Encounter: Payer: Self-pay | Admitting: Physician Assistant

## 2022-05-09 ENCOUNTER — Ambulatory Visit: Payer: Medicare Other | Admitting: Physician Assistant

## 2022-05-09 DIAGNOSIS — D485 Neoplasm of uncertain behavior of skin: Secondary | ICD-10-CM | POA: Diagnosis not present

## 2022-05-09 DIAGNOSIS — L821 Other seborrheic keratosis: Secondary | ICD-10-CM | POA: Diagnosis not present

## 2022-05-09 NOTE — Progress Notes (Signed)
   Follow-Up Visit   Subjective  Faith Barrett is a 88 y.o. female who presents for the following: Follow-up (6 month follow up re check nose last visit ln2 she stated its smooth. 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   The following portions of the chart were reviewed this encounter and updated as appropriate:  Tobacco  Allergies  Meds  Problems  Med Hx  Surg Hx  Fam Hx      Objective  Well appearing patient in no apparent distress; mood and affect are within normal limits.  A focused examination was performed including face and scalp. Relevant physical exam findings are noted in the Assessment and Plan.  Left Temple Verrucous growth.   Assessment & Plan  Neoplasm of uncertain behavior of skin Left Temple  Skin / nail biopsy Type of biopsy: tangential   Informed consent: discussed and consent obtained   Timeout: patient name, date of birth, surgical site, and procedure verified   Procedure prep:  Patient was prepped and draped in usual sterile fashion (Non sterile) Prep type:  Chlorhexidine Anesthesia: the lesion was anesthetized in a standard fashion   Anesthetic:  1% lidocaine w/ epinephrine 1-100,000 local infiltration Instrument used: flexible razor blade   Outcome: patient tolerated procedure well   Post-procedure details: wound care instructions given    Specimen 1 - Surgical pathology Differential Diagnosis: wart  Check Margins: No    I, Nikyla Navedo, PA-C, have reviewed all documentation's for this visit.  The documentation on 05/09/22 for the exam, diagnosis, procedures and orders are all accurate and complete.

## 2022-05-29 DIAGNOSIS — M85851 Other specified disorders of bone density and structure, right thigh: Secondary | ICD-10-CM | POA: Diagnosis not present

## 2022-05-29 DIAGNOSIS — Z1231 Encounter for screening mammogram for malignant neoplasm of breast: Secondary | ICD-10-CM | POA: Diagnosis not present

## 2022-05-29 DIAGNOSIS — Z780 Asymptomatic menopausal state: Secondary | ICD-10-CM | POA: Diagnosis not present

## 2022-08-08 DIAGNOSIS — R7309 Other abnormal glucose: Secondary | ICD-10-CM | POA: Diagnosis not present

## 2022-08-08 DIAGNOSIS — E559 Vitamin D deficiency, unspecified: Secondary | ICD-10-CM | POA: Diagnosis not present

## 2022-08-08 DIAGNOSIS — R946 Abnormal results of thyroid function studies: Secondary | ICD-10-CM | POA: Diagnosis not present

## 2022-08-08 DIAGNOSIS — E538 Deficiency of other specified B group vitamins: Secondary | ICD-10-CM | POA: Diagnosis not present

## 2022-08-08 DIAGNOSIS — I10 Essential (primary) hypertension: Secondary | ICD-10-CM | POA: Diagnosis not present

## 2022-08-08 DIAGNOSIS — Z0000 Encounter for general adult medical examination without abnormal findings: Secondary | ICD-10-CM | POA: Diagnosis not present

## 2022-08-08 DIAGNOSIS — Z79899 Other long term (current) drug therapy: Secondary | ICD-10-CM | POA: Diagnosis not present

## 2022-08-15 DIAGNOSIS — E538 Deficiency of other specified B group vitamins: Secondary | ICD-10-CM | POA: Diagnosis not present

## 2022-08-15 DIAGNOSIS — I10 Essential (primary) hypertension: Secondary | ICD-10-CM | POA: Diagnosis not present

## 2022-08-15 DIAGNOSIS — E559 Vitamin D deficiency, unspecified: Secondary | ICD-10-CM | POA: Diagnosis not present

## 2022-08-15 DIAGNOSIS — Z0000 Encounter for general adult medical examination without abnormal findings: Secondary | ICD-10-CM | POA: Diagnosis not present

## 2022-08-15 DIAGNOSIS — N390 Urinary tract infection, site not specified: Secondary | ICD-10-CM | POA: Diagnosis not present

## 2022-08-27 DIAGNOSIS — N390 Urinary tract infection, site not specified: Secondary | ICD-10-CM | POA: Diagnosis not present

## 2022-08-27 DIAGNOSIS — R5383 Other fatigue: Secondary | ICD-10-CM | POA: Diagnosis not present

## 2022-08-27 DIAGNOSIS — Z7689 Persons encountering health services in other specified circumstances: Secondary | ICD-10-CM | POA: Diagnosis not present

## 2022-08-27 DIAGNOSIS — R42 Dizziness and giddiness: Secondary | ICD-10-CM | POA: Diagnosis not present

## 2022-09-20 DIAGNOSIS — R6883 Chills (without fever): Secondary | ICD-10-CM | POA: Diagnosis not present

## 2022-09-20 DIAGNOSIS — I10 Essential (primary) hypertension: Secondary | ICD-10-CM | POA: Diagnosis not present

## 2022-09-20 DIAGNOSIS — R5383 Other fatigue: Secondary | ICD-10-CM | POA: Diagnosis not present

## 2022-09-20 DIAGNOSIS — E559 Vitamin D deficiency, unspecified: Secondary | ICD-10-CM | POA: Diagnosis not present

## 2022-09-20 DIAGNOSIS — I498 Other specified cardiac arrhythmias: Secondary | ICD-10-CM | POA: Diagnosis not present

## 2022-09-20 DIAGNOSIS — I491 Atrial premature depolarization: Secondary | ICD-10-CM | POA: Diagnosis not present

## 2022-11-09 DIAGNOSIS — R319 Hematuria, unspecified: Secondary | ICD-10-CM | POA: Diagnosis not present

## 2022-11-14 ENCOUNTER — Ambulatory Visit: Payer: Medicare Other | Admitting: Physician Assistant

## 2023-02-19 DIAGNOSIS — L814 Other melanin hyperpigmentation: Secondary | ICD-10-CM | POA: Diagnosis not present

## 2023-02-19 DIAGNOSIS — L578 Other skin changes due to chronic exposure to nonionizing radiation: Secondary | ICD-10-CM | POA: Diagnosis not present

## 2023-02-19 DIAGNOSIS — L570 Actinic keratosis: Secondary | ICD-10-CM | POA: Diagnosis not present

## 2023-02-19 DIAGNOSIS — L820 Inflamed seborrheic keratosis: Secondary | ICD-10-CM | POA: Diagnosis not present

## 2023-02-19 DIAGNOSIS — L720 Epidermal cyst: Secondary | ICD-10-CM | POA: Diagnosis not present

## 2023-03-26 DIAGNOSIS — E559 Vitamin D deficiency, unspecified: Secondary | ICD-10-CM | POA: Diagnosis not present

## 2023-03-26 DIAGNOSIS — Z7689 Persons encountering health services in other specified circumstances: Secondary | ICD-10-CM | POA: Diagnosis not present

## 2023-03-28 DIAGNOSIS — E559 Vitamin D deficiency, unspecified: Secondary | ICD-10-CM | POA: Diagnosis not present

## 2023-03-28 DIAGNOSIS — Z7689 Persons encountering health services in other specified circumstances: Secondary | ICD-10-CM | POA: Diagnosis not present

## 2023-04-02 DIAGNOSIS — R319 Hematuria, unspecified: Secondary | ICD-10-CM | POA: Diagnosis not present
